# Patient Record
Sex: Male | Born: 1960 | Race: White | Hispanic: No | Smoking: Current every day smoker
Health system: Southern US, Community
[De-identification: ages and names within clinical notes are randomized; demographics above are authoritative.]

## PROBLEM LIST (undated history)

## (undated) DIAGNOSIS — K219 Gastro-esophageal reflux disease without esophagitis: Secondary | ICD-10-CM

## (undated) DIAGNOSIS — I639 Cerebral infarction, unspecified: Secondary | ICD-10-CM

## (undated) DIAGNOSIS — J449 Chronic obstructive pulmonary disease, unspecified: Secondary | ICD-10-CM

## (undated) HISTORY — PX: ORTHOPEDIC SURGERY: SHX850

## (undated) HISTORY — PX: OTHER SURGICAL HISTORY: SHX169

---

## 2006-06-16 ENCOUNTER — Emergency Department (HOSPITAL_COMMUNITY): Admission: EM | Admit: 2006-06-16 | Discharge: 2006-06-16 | Payer: Self-pay | Admitting: Emergency Medicine

## 2007-06-06 ENCOUNTER — Emergency Department (HOSPITAL_COMMUNITY): Admission: EM | Admit: 2007-06-06 | Discharge: 2007-06-06 | Payer: Self-pay | Admitting: Emergency Medicine

## 2009-05-01 ENCOUNTER — Emergency Department (HOSPITAL_COMMUNITY): Admission: EM | Admit: 2009-05-01 | Discharge: 2009-05-01 | Payer: Self-pay | Admitting: Emergency Medicine

## 2009-05-27 ENCOUNTER — Emergency Department (HOSPITAL_COMMUNITY): Admission: EM | Admit: 2009-05-27 | Discharge: 2009-05-28 | Payer: Self-pay | Admitting: Emergency Medicine

## 2009-09-14 ENCOUNTER — Emergency Department (HOSPITAL_COMMUNITY): Admission: EM | Admit: 2009-09-14 | Discharge: 2009-09-15 | Payer: Self-pay | Admitting: Emergency Medicine

## 2010-06-04 ENCOUNTER — Emergency Department (HOSPITAL_COMMUNITY): Admission: EM | Admit: 2010-06-04 | Discharge: 2010-06-05 | Payer: Self-pay | Admitting: Emergency Medicine

## 2010-07-22 ENCOUNTER — Emergency Department (HOSPITAL_COMMUNITY): Admission: EM | Admit: 2010-07-22 | Discharge: 2010-07-22 | Payer: Self-pay | Admitting: Emergency Medicine

## 2010-07-27 ENCOUNTER — Emergency Department (HOSPITAL_COMMUNITY): Admission: EM | Admit: 2010-07-27 | Discharge: 2010-07-27 | Payer: Self-pay | Admitting: Emergency Medicine
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
## 2011-03-12 LAB — CULTURE, ROUTINE-ABSCESS: Gram Stain: NONE SEEN

## 2014-02-23 ENCOUNTER — Encounter (HOSPITAL_COMMUNITY): Payer: Self-pay | Admitting: Emergency Medicine

## 2014-02-23 ENCOUNTER — Emergency Department (HOSPITAL_COMMUNITY)
Admission: EM | Admit: 2014-02-23 | Discharge: 2014-02-23 | Disposition: A | Discharge status: Home / Self Care | Payer: Self-pay | Attending: Emergency Medicine | Admitting: Emergency Medicine

## 2014-02-23 DIAGNOSIS — K029 Dental caries, unspecified: Secondary | ICD-10-CM | POA: Insufficient documentation

## 2014-02-23 DIAGNOSIS — K044 Acute apical periodontitis of pulpal origin: Secondary | ICD-10-CM | POA: Insufficient documentation

## 2014-02-23 DIAGNOSIS — K047 Periapical abscess without sinus: Secondary | ICD-10-CM

## 2014-02-23 DIAGNOSIS — F17200 Nicotine dependence, unspecified, uncomplicated: Secondary | ICD-10-CM | POA: Insufficient documentation

## 2014-02-23 MED ORDER — TRAMADOL HCL 50 MG PO TABS: 50.0000 mg | ORAL_TABLET | Freq: Four times a day (QID) | ORAL | Status: DC | PRN

## 2014-02-23 MED ORDER — AMOXICILLIN 500 MG PO CAPS: 500.0000 mg | ORAL_CAPSULE | Freq: Three times a day (TID) | ORAL | Status: DC

## 2014-02-23 MED ORDER — TRAMADOL HCL 50 MG PO TABS
50.0000 mg | ORAL_TABLET | Freq: Once | ORAL | Status: AC
  Administered 2014-02-23: 50 mg via ORAL
  Filled 2014-02-23: qty 1

## 2014-02-23 MED ORDER — AMOXICILLIN 250 MG PO CAPS
500.0000 mg | ORAL_CAPSULE | Freq: Once | ORAL | Status: AC
  Administered 2014-02-23: 500 mg via ORAL
  Filled 2014-02-23: qty 2

## 2014-02-23 NOTE — ED Provider Notes (Signed)
CSN: [id number]     Arrival date & time 02/23/14  0750 History   First MD Initiated Contact with Patient 02/23/14 [phone]     Chief Complaint  Patient presents with  . Dental Problem     (Consider location/radiation/quality/duration/timing/severity/associated sxs/prior Treatment) HPI Comments: Justin Price is a 53 y.o. Male presenting with right upper dental pain for the past 2 days.  He also had swelling around the tooth which drained a foul tasting fluid yesterday.  He has a history of chronic dental decay and is trying to get in with [facility] dental clinic to address his dental problems.  There has been no fevers, chills,  or vomiting, also no complaint of difficulty swallowing, although chewing makes pain worse. He has had some mild nausea. The patient has taken ibuprofen without relief of symptoms.         The history is provided by the patient.    History reviewed. No pertinent past medical history. History reviewed. No pertinent past surgical history. No family history on file. History  Substance Use Topics  . Smoking status: Current Every Day Smoker -- 2.00 packs/day  . Smokeless tobacco: Not on file  . Alcohol Use: No    Review of Systems  Constitutional: Negative for fever.  HENT: Positive for dental problem. Negative for facial swelling and sore throat.   Respiratory: Negative for shortness of breath.   Musculoskeletal: Negative for neck pain and neck stiffness.      Allergies  Review of patient's allergies indicates no known allergies.  Home Medications   Current Outpatient Rx  Name  Route  Sig  Dispense  Refill  . amoxicillin (AMOXIL) 500 MG capsule   Oral   Take 1 capsule (500 mg total) by mouth 3 (three) times daily.   30 capsule   0   . traMADol (ULTRAM) 50 MG tablet   Oral   Take 1 tablet (50 mg total) by mouth every 6 (six) hours as needed.   20 tablet   0    BP 116/73  Pulse 73  Temp(Src) 98.3 F (36.8 C)  Resp 18  Ht 5\' 11"   (1.803 m)  Wt 165 lb (74.844 kg)  BMI 23.02 kg/m2  SpO2 98% Physical Exam  Constitutional: He is oriented to person, place, and time. He appears well-developed and well-nourished. No distress.  HENT:  Head: Normocephalic and atraumatic.  Right Ear: Tympanic membrane and external ear normal.  Left Ear: Tympanic membrane and external ear normal.  Mouth/Throat: Oropharynx is clear and moist and mucous membranes are normal. No oral lesions. No trismus in the jaw. No dental abscesses or uvula swelling.    Eyes: Conjunctivae are normal.  Neck: Normal range of motion. Neck supple.  Cardiovascular: Normal rate and normal heart sounds.   Pulmonary/Chest: Effort normal.  Abdominal: He exhibits no distension.  Musculoskeletal: Normal range of motion.  Lymphadenopathy:       Head (right side): No submandibular, no tonsillar and no preauricular adenopathy present.       Head (left side): No submandibular, no tonsillar and no preauricular adenopathy present.    He has no cervical adenopathy.  Neurological: He is alert and oriented to person, place, and time.  Skin: Skin is warm and dry. No erythema.  Psychiatric: He has a normal mood and affect.    ED Course  Procedures (including critical care time) Labs Review Labs Reviewed - No data to display Imaging Review No results found.   EKG Interpretation  None      MDM   Final diagnoses:  Dental infection    Pt prescribed amoxil, tramadol.  Encouraged dental followup as planned.  May continue ibuprofen unless nausea persists.    Burgess AmorJulie Averie Hornbaker, PA-C 02/23/14 [phone]

## 2014-02-23 NOTE — Discharge Instructions (Signed)
°  Dental Abscess °A dental abscess is a collection of infected fluid (pus) from a bacterial infection in the inner part of the tooth (pulp). It usually occurs at the end of the tooth's root.  °CAUSES  °· Severe tooth decay. °· Trauma to the tooth that allows bacteria to enter into the pulp, such as a broken or chipped tooth. °SYMPTOMS  °· Severe pain in and around the infected tooth. °· Swelling and redness around the abscessed tooth or in the mouth or face. °· Tenderness. °· Pus drainage. °· Bad breath. °· Bitter taste in the mouth. °· Difficulty swallowing. °· Difficulty opening the mouth. °· Nausea. °· Vomiting. °· Chills. °· Swollen neck glands. °DIAGNOSIS  °· A medical and dental history will be taken. °· An examination will be performed by tapping on the abscessed tooth. °· X-rays may be taken of the tooth to identify the abscess. °TREATMENT °The goal of treatment is to eliminate the infection. You may be prescribed antibiotic medicine to stop the infection from spreading. A root canal may be performed to save the tooth. If the tooth cannot be saved, it may be pulled (extracted) and the abscess may be drained.  °HOME CARE INSTRUCTIONS °· Only take over-the-counter or prescription medicines for pain, fever, or discomfort as directed by your caregiver. °· Rinse your mouth (gargle) often with salt water (¼ tsp salt in 8 oz [250 ml] of warm water) to relieve pain or swelling. °· Do not drive after taking pain medicine (narcotics). °· Do not apply heat to the outside of your face. °· Return to your dentist for further treatment as directed. °SEEK MEDICAL CARE IF: °· Your pain is not helped by medicine. °· Your pain is getting worse instead of better. °SEEK IMMEDIATE MEDICAL CARE IF: °· You have a fever or persistent symptoms for more than 2 3 days. °· You have a fever and your symptoms suddenly get worse. °· You have chills or a very bad headache. °· You have problems breathing or swallowing. °· You have trouble  opening your mouth. °· You have swelling in the neck or around the eye. °Document Released: 11/23/2005 Document Revised: 08/17/2012 Document Reviewed: 03/03/2011 °ExitCare® Patient Information ©2014 [employer]. ° ° °

## 2014-02-23 NOTE — ED Notes (Signed)
Pt c/o right upper toothache x 2 days.

## 2014-02-23 NOTE — ED Notes (Signed)
Patient with no complaints at this time. Respirations even and unlabored. Skin warm/dry. Discharge instructions reviewed with patient at this time. Patient given opportunity to voice concerns/ask questions. Patient discharged at this time and left Emergency Department with steady gait.   

## 2014-02-28 NOTE — ED Provider Notes (Signed)
Medical screening examination/treatment/procedure(s) were performed by non-physician practitioner and as supervising physician I was immediately available for consultation/collaboration.   EKG Interpretation None        Christopher J. Pollina, MD 02/28/14 0701 

## 2014-10-13 ENCOUNTER — Encounter (HOSPITAL_COMMUNITY): Payer: Self-pay | Admitting: *Deleted

## 2014-10-13 ENCOUNTER — Emergency Department (HOSPITAL_COMMUNITY)
Admission: EM | Admit: 2014-10-13 | Discharge: 2014-10-13 | Disposition: A | Discharge status: Home / Self Care | Payer: Self-pay | Attending: Emergency Medicine | Admitting: Emergency Medicine

## 2014-10-13 DIAGNOSIS — K529 Noninfective gastroenteritis and colitis, unspecified: Secondary | ICD-10-CM

## 2014-10-13 DIAGNOSIS — Z720 Tobacco use: Secondary | ICD-10-CM | POA: Insufficient documentation

## 2014-10-13 DIAGNOSIS — K5289 Other specified noninfective gastroenteritis and colitis: Secondary | ICD-10-CM | POA: Insufficient documentation

## 2014-10-13 LAB — BASIC METABOLIC PANEL
Anion gap: 16 — ABNORMAL HIGH (ref 5–15)
BUN: 10 mg/dL (ref 6–23)
CO2: 21 mEq/L (ref 19–32)
CREATININE: 1.03 mg/dL (ref 0.50–1.35)
Calcium: 9.2 mg/dL (ref 8.4–10.5)
Chloride: 97 mEq/L (ref 96–112)
GFR, EST NON AFRICAN AMERICAN: 82 mL/min — AB (ref >90)
Glucose, Bld: 133 mg/dL — ABNORMAL HIGH (ref 70–99)
Potassium: 3.5 mEq/L — ABNORMAL LOW (ref 3.7–5.3)
SODIUM: 134 meq/L — AB (ref 137–147)

## 2014-10-13 LAB — CBC WITH DIFFERENTIAL/PLATELET
BASOS ABS: 0 10*3/uL (ref 0.0–0.1)
Basophils Relative: 0 % (ref 0–1)
Eosinophils Absolute: 0 10*3/uL (ref 0.0–0.7)
Eosinophils Relative: 0 % (ref 0–5)
HEMATOCRIT: 41.6 % (ref 39.0–52.0)
Hemoglobin: 14.9 g/dL (ref 13.0–17.0)
LYMPHS ABS: 0.9 10*3/uL (ref 0.7–4.0)
LYMPHS PCT: 5 % — AB (ref 12–46)
MCH: 32.4 pg (ref 26.0–34.0)
MCHC: 35.8 g/dL (ref 30.0–36.0)
MCV: 90.4 fL (ref 78.0–100.0)
MONOS PCT: 6 % (ref 3–12)
Monocytes Absolute: 1.2 10*3/uL — ABNORMAL HIGH (ref 0.1–1.0)
Neutro Abs: 17 10*3/uL — ABNORMAL HIGH (ref 1.7–7.7)
Neutrophils Relative %: 89 % — ABNORMAL HIGH (ref 43–77)
PLATELETS: 248 10*3/uL (ref 150–400)
RBC: 4.6 MIL/uL (ref 4.22–5.81)
RDW: 13.3 % (ref 11.5–15.5)
WBC: 19.1 10*3/uL — AB (ref 4.0–10.5)

## 2014-10-13 MED ORDER — ONDANSETRON HCL 4 MG/2ML IJ SOLN
4.0000 mg | Freq: Once | INTRAMUSCULAR | Status: AC
  Administered 2014-10-13: 4 mg via INTRAVENOUS
  Filled 2014-10-13: qty 2

## 2014-10-13 MED ORDER — ONDANSETRON 4 MG PO TBDP: 4.0000 mg | ORAL_TABLET | Freq: Three times a day (TID) | ORAL | Status: DC | PRN

## 2014-10-13 MED ORDER — METRONIDAZOLE 500 MG PO TABS
500.0000 mg | ORAL_TABLET | Freq: Once | ORAL | Status: AC
  Administered 2014-10-13: 500 mg via ORAL
  Filled 2014-10-13: qty 1

## 2014-10-13 MED ORDER — CIPROFLOXACIN HCL 250 MG PO TABS
500.0000 mg | ORAL_TABLET | Freq: Once | ORAL | Status: AC
  Administered 2014-10-13: 500 mg via ORAL
  Filled 2014-10-13: qty 2

## 2014-10-13 MED ORDER — METRONIDAZOLE 500 MG PO TABS: 500.0000 mg | ORAL_TABLET | Freq: Two times a day (BID) | ORAL | Status: DC

## 2014-10-13 MED ORDER — DICYCLOMINE HCL 20 MG PO TABS: 20.0000 mg | ORAL_TABLET | Freq: Two times a day (BID) | ORAL | Status: DC

## 2014-10-13 MED ORDER — GLYCOPYRROLATE 0.2 MG/ML IJ SOLN
0.3000 mg | Freq: Once | INTRAMUSCULAR | Status: AC
  Administered 2014-10-13: 0.3 mg via INTRAVENOUS
  Filled 2014-10-13: qty 2

## 2014-10-13 MED ORDER — CIPROFLOXACIN HCL 500 MG PO TABS: 500.0000 mg | ORAL_TABLET | Freq: Two times a day (BID) | ORAL | Status: DC

## 2014-10-13 MED ORDER — ACETAMINOPHEN 325 MG PO TABS
650.0000 mg | ORAL_TABLET | Freq: Once | ORAL | Status: AC
  Administered 2014-10-13: 650 mg via ORAL
  Filled 2014-10-13: qty 2

## 2014-10-13 MED ORDER — DEXTROSE 5 % IV SOLN
1000.0000 mg | Freq: Once | INTRAVENOUS | Status: DC
  Filled 2014-10-13: qty 10

## 2014-10-13 MED ORDER — KETOROLAC TROMETHAMINE 30 MG/ML IJ SOLN
30.0000 mg | Freq: Once | INTRAMUSCULAR | Status: AC
  Administered 2014-10-13: 30 mg via INTRAVENOUS
  Filled 2014-10-13: qty 1

## 2014-10-13 MED ORDER — SODIUM CHLORIDE 0.9 % IV BOLUS (SEPSIS)
1000.0000 mL | Freq: Once | INTRAVENOUS | Status: AC
  Administered 2014-10-13: 1000 mL via INTRAVENOUS

## 2014-10-13 NOTE — ED Provider Notes (Signed)
CSN: [id number]     Arrival date & time 10/13/14  [id number] History  This chart was scribe for Rolland PorterMark Sennie Borden, MD by Angelene GiovanniEmmanuella Mensah, ED Scribe. The patient was seen in room APA06/APA06 and the patient's care was started at 7:34 AM.    Chief Complaint  Patient presents with  . Fever    The history is provided by the patient. No language interpreter was used.   HPI Comments: Justin Price is a 53 y.o. male who presents to the Emergency Department complaining of intermittent severe stomach cramps. He reports associated fever, vomiting, diarrhea, acid reflex,CP, and slight dizziness. He denies blood in his diarrhea. He states that the cramp is worse after vomiting and after the diarrheal episodes. He reports that his wife was here a few weeks ago for 3 days with similar symptoms. She states that her sickness was attributed to a virus. He has NKDA.   History reviewed. No pertinent past medical history. History reviewed. No pertinent past surgical history. No family history on file. History  Substance Use Topics  . Smoking status: Current Every Day Smoker -- 2.00 packs/day  . Smokeless tobacco: Not on file  . Alcohol Use: No    Review of Systems  Constitutional: Negative for diaphoresis, appetite change and fatigue.  HENT: Negative for mouth sores and trouble swallowing.   Eyes: Negative for visual disturbance.  Respiratory: Negative for chest tightness, shortness of breath and wheezing.   Gastrointestinal: Negative for abdominal pain and abdominal distention.  Endocrine: Negative for polydipsia, polyphagia and polyuria.  Genitourinary: Negative for frequency and hematuria.  Musculoskeletal: Negative for gait problem.  Skin: Negative for color change and pallor.  Neurological: Negative for dizziness, syncope and light-headedness.  Hematological: Does not bruise/bleed easily.  Psychiatric/Behavioral: Negative for behavioral problems.      Allergies  Review of patient's allergies  indicates no known allergies.  Home Medications   Prior to Admission medications   Medication Sig Start Date End Date Taking? Authorizing Provider  amoxicillin (AMOXIL) 500 MG capsule Take 1 capsule (500 mg total) by mouth 3 (three) times daily. Patient not taking: Reported on 10/13/2014 02/23/14   Burgess AmorJulie Idol, PA-C  ciprofloxacin (CIPRO) 500 MG tablet Take 1 tablet (500 mg total) by mouth every 12 (twelve) hours. 10/13/14   Rolland PorterMark Dewarren Ledbetter, MD  dicyclomine (BENTYL) 20 MG tablet Take 1 tablet (20 mg total) by mouth 2 (two) times daily. 10/13/14   Rolland PorterMark Onnika Siebel, MD  metroNIDAZOLE (FLAGYL) 500 MG tablet Take 1 tablet (500 mg total) by mouth 2 (two) times daily. 10/13/14   Rolland PorterMark Nieko Clarin, MD  ondansetron (ZOFRAN ODT) 4 MG disintegrating tablet Take 1 tablet (4 mg total) by mouth every 8 (eight) hours as needed for nausea. 10/13/14   Rolland PorterMark Raye Wiens, MD  traMADol (ULTRAM) 50 MG tablet Take 1 tablet (50 mg total) by mouth every 6 (six) hours as needed. Patient not taking: Reported on 10/13/2014 02/23/14   Burgess AmorJulie Idol, PA-C   BP 109/80 mmHg  Temp(Src) 100.3 F (37.9 C) (Oral)  Resp 22  Ht 5\' 10"  (1.778 m)  Wt 160 lb (72.576 kg)  BMI 22.96 kg/m2  SpO2 97% Physical Exam  Constitutional: He is oriented to person, place, and time. He appears well-developed and well-nourished. No distress.  HENT:  Head: Normocephalic.  Eyes: Conjunctivae are normal. Pupils are equal, round, and reactive to light. No scleral icterus.  Neck: Normal range of motion. Neck supple. No thyromegaly present.  Cardiovascular: Normal rate and regular rhythm.  Exam  reveals no gallop and no friction rub.   No murmur heard. Pulmonary/Chest: Effort normal and breath sounds normal. No respiratory distress. He has no wheezes. He has no rales.  Abdominal: Soft. Bowel sounds are normal. He exhibits no distension. There is no tenderness. There is no rebound.  Complains of diffuse pain but non tender on exam.  Musculoskeletal: Normal range of motion.   Neurological: He is alert and oriented to person, place, and time.  Skin: Skin is warm and dry. No rash noted.  Psychiatric: He has a normal mood and affect. His behavior is normal.    ED Course  Procedures (including critical care time) DIAGNOSTIC STUDIES: Oxygen Saturation is 97% on RA, adequate by my interpretation.    COORDINATION OF CARE: 7:38 AM- Pt advised of plan for treatment and pt agrees.  Labs Review Labs Reviewed  CBC WITH DIFFERENTIAL - Abnormal; Notable for the following:    WBC 19.1 (*)    Neutrophils Relative % 89 (*)    Neutro Abs 17.0 (*)    Lymphocytes Relative 5 (*)    Monocytes Absolute 1.2 (*)    All other components within normal limits  BASIC METABOLIC PANEL - Abnormal; Notable for the following:    Sodium 134 (*)    Potassium 3.5 (*)    Glucose, Bld 133 (*)    GFR calc non Af Amer 82 (*)    Anion gap 16 (*)    All other components within normal limits    Imaging Review No results found.   EKG Interpretation None      MDM   Final diagnoses:  Colitis      I personally performed the services described in this documentation, which was scribed in my presence. The recorded information has been reviewed and is accurate.    Rolland PorterMark Oluwaseun Bruyere, MD 10/13/14 [phone]

## 2014-10-13 NOTE — ED Notes (Signed)
Pt states he started feeling like he was getting sick a few days ago. Starting yesterday he began having nausea, vomiting, and diarrhea. A fever accompanies these symptoms. Pt has taken tylenol with little relief in fever.

## 2014-10-13 NOTE — ED Notes (Signed)
Patient reports feeling sick for the past three weeks. Pt states his symptoms got worse last night. Pt states he had a temp 104 last night. Last night. Pt reports taking 50 mg ibuprofen x 2 pills last night @ 1030 p.m. And 800 mg ibuprofen @ 12.00 p.m. Pt had two episodes of emesis and 10 episodes of diarrhea. Pt reports "greenish black" liquid stool. Pt states his stool is "smelly."

## 2014-10-13 NOTE — Discharge Instructions (Signed)
Return to the emergency room with worsening pain, or any pain that localizes to one spot in your abdomen. Good handwashing, use hand sanitizers.  Colitis Colitis is inflammation of the colon. Colitis can be a short-term or long-standing (chronic) illness. Crohn's disease and ulcerative colitis are 2 types of colitis which are chronic. They usually require lifelong treatment. CAUSES  There are many different causes of colitis, including:  Viruses.  Germs (bacteria).  Medicine reactions. SYMPTOMS   Diarrhea.  Intestinal bleeding.  Pain.  Fever.  Throwing up (vomiting).  Tiredness (fatigue).  Weight loss.  Bowel blockage. DIAGNOSIS  The diagnosis of colitis is based on examination and stool or blood tests. X-rays, CT scan, and colonoscopy may also be needed. TREATMENT  Treatment may include:  Fluids given through the vein (intravenously).  Bowel rest (nothing to eat or drink for a period of time).  Medicine for pain and diarrhea.  Medicines (antibiotics) that kill germs.  Cortisone medicines.  Surgery. HOME CARE INSTRUCTIONS   Get plenty of rest.  Drink enough water and fluids to keep your urine clear or pale yellow.  Eat a well-balanced diet.  Call your caregiver for follow-up as recommended. SEEK IMMEDIATE MEDICAL CARE IF:   You develop chills.  You have an oral temperature above 102 F (38.9 C), not controlled by medicine.  You have extreme weakness, fainting, or dehydration.  You have repeated vomiting.  You develop severe belly (abdominal) pain or are passing bloody or tarry stools. MAKE SURE YOU:   Understand these instructions.  Will watch your condition.  Will get help right away if you are not doing well or get worse. Document Released: 12/31/2004 Document Revised: 02/15/2012 Document Reviewed: 03/28/2010 [facility] Patient Information 2015 [place], [place]. This information is not intended to replace advice given to you by your health  care provider. Make sure you discuss any questions you have with your health care provider.

## 2017-10-09 ENCOUNTER — Emergency Department (HOSPITAL_COMMUNITY): Payer: Self-pay

## 2017-10-09 ENCOUNTER — Encounter (HOSPITAL_COMMUNITY): Payer: Self-pay | Admitting: *Deleted

## 2017-10-09 ENCOUNTER — Emergency Department (HOSPITAL_COMMUNITY)
Admission: EM | Admit: 2017-10-09 | Discharge: 2017-10-09 | Disposition: A | Discharge status: Home / Self Care | Payer: Self-pay | Attending: Emergency Medicine | Admitting: Emergency Medicine

## 2017-10-09 DIAGNOSIS — J441 Chronic obstructive pulmonary disease with (acute) exacerbation: Secondary | ICD-10-CM | POA: Insufficient documentation

## 2017-10-09 DIAGNOSIS — F17210 Nicotine dependence, cigarettes, uncomplicated: Secondary | ICD-10-CM | POA: Insufficient documentation

## 2017-10-09 LAB — COMPREHENSIVE METABOLIC PANEL
ALBUMIN: 3.7 g/dL (ref 3.5–5.0)
ALK PHOS: 85 U/L (ref 38–126)
ALT: 20 U/L (ref 17–63)
ANION GAP: 7 (ref 5–15)
AST: 19 U/L (ref 15–41)
BUN: 8 mg/dL (ref 6–20)
CALCIUM: 9.3 mg/dL (ref 8.9–10.3)
CO2: 28 mmol/L (ref 22–32)
Chloride: 105 mmol/L (ref 101–111)
Creatinine, Ser: 0.89 mg/dL (ref 0.61–1.24)
GFR calc Af Amer: >60 mL/min (ref >60)
GFR calc non Af Amer: >60 mL/min (ref >60)
GLUCOSE: 91 mg/dL (ref 65–99)
Potassium: 4.1 mmol/L (ref 3.5–5.1)
SODIUM: 140 mmol/L (ref 135–145)
Total Bilirubin: 0.4 mg/dL (ref 0.3–1.2)
Total Protein: 6.7 g/dL (ref 6.5–8.1)

## 2017-10-09 LAB — CBC WITH DIFFERENTIAL/PLATELET
BASOS ABS: 0 10*3/uL (ref 0.0–0.1)
BASOS PCT: 0 %
EOS ABS: 0.4 10*3/uL (ref 0.0–0.7)
Eosinophils Relative: 5 %
HCT: 41.2 % (ref 39.0–52.0)
HEMOGLOBIN: 14 g/dL (ref 13.0–17.0)
Lymphocytes Relative: 30 %
Lymphs Abs: 2.7 10*3/uL (ref 0.7–4.0)
MCH: 31.8 pg (ref 26.0–34.0)
MCHC: 34 g/dL (ref 30.0–36.0)
MCV: 93.6 fL (ref 78.0–100.0)
Monocytes Absolute: 0.6 10*3/uL (ref 0.1–1.0)
Monocytes Relative: 7 %
NEUTROS ABS: 5.4 10*3/uL (ref 1.7–7.7)
NEUTROS PCT: 58 %
Platelets: 304 10*3/uL (ref 150–400)
RBC: 4.4 MIL/uL (ref 4.22–5.81)
RDW: 13.6 % (ref 11.5–15.5)
WBC: 9.1 10*3/uL (ref 4.0–10.5)

## 2017-10-09 LAB — TROPONIN I: Troponin I: <0.03 ng/mL (ref <0.03)

## 2017-10-09 IMAGING — CT CT ANGIO CHEST-ABD-PELV FOR DISSECTION W/ AND WO/W CM
2 of 7 series · 13 of 46 positions shown, 15 images · IV contrast (Isovue)
Comparison: Chest radiograph dated [DATE].

CLINICAL DATA: 56-year-old male with shortness of breath and chest
pressure.

EXAM:
CT ANGIOGRAPHY CHEST, ABDOMEN AND PELVIS
TECHNIQUE: Multidetector CT imaging through the chest, abdomen and pelvis was
performed using the standard protocol during bolus administration of
intravenous contrast. Multiplanar reconstructed images and MIPs were
obtained and reviewed to evaluate the vascular anatomy.
CONTRAST:  100 cc Isovue 370

[Series 7: dissection axial arterial · axial · arterial · 0.72mm/px · z∈[+817,+1387]mm · 10 of 220 slices shown, 12 images]
[im 15/220  soft-tissue]
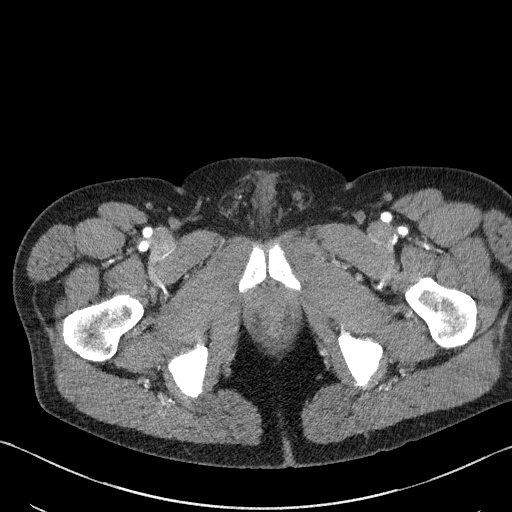
[im 15/220  bone]
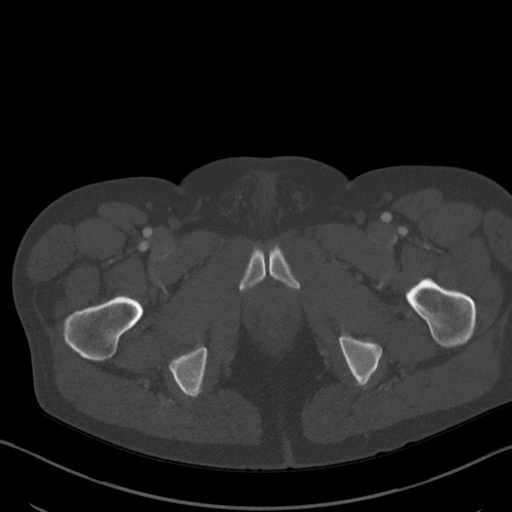
[im 44/220  soft-tissue]
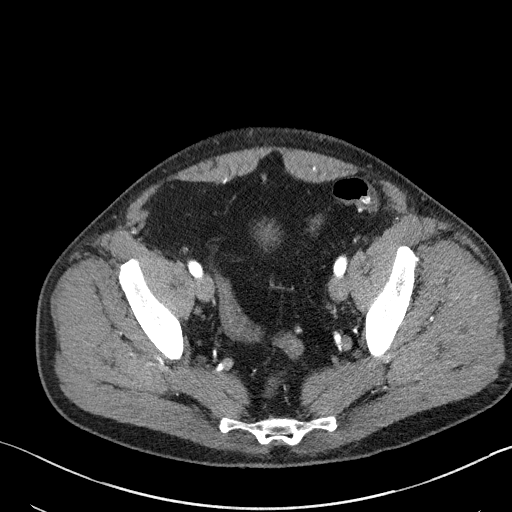
[im 59/220  soft-tissue]
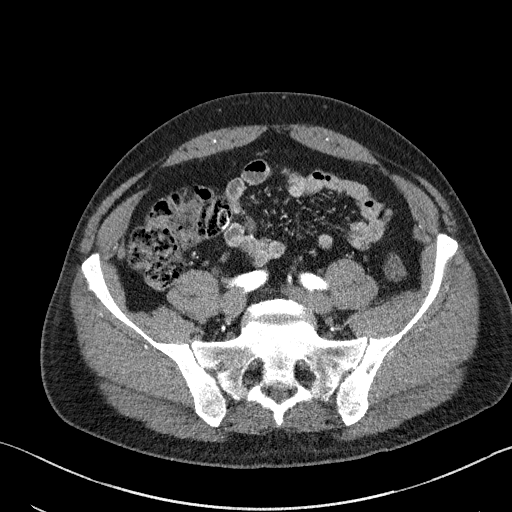
[im 74/220  soft-tissue]
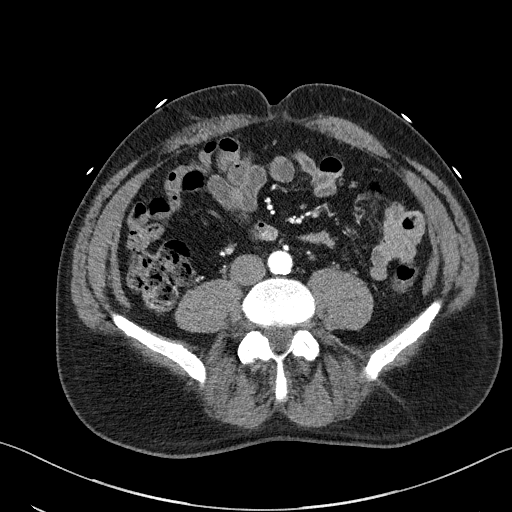
[im 103/220  soft-tissue]
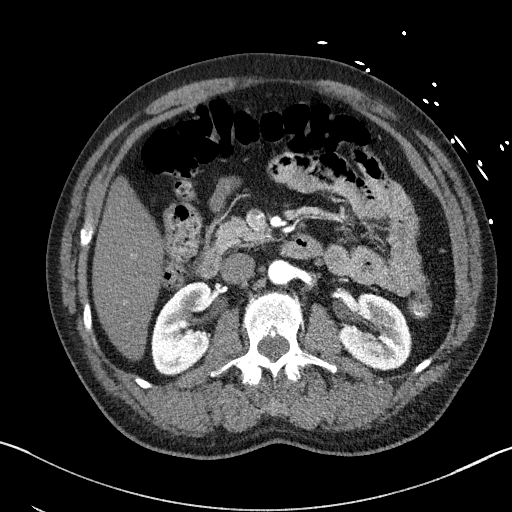
[im 117/220  soft-tissue]
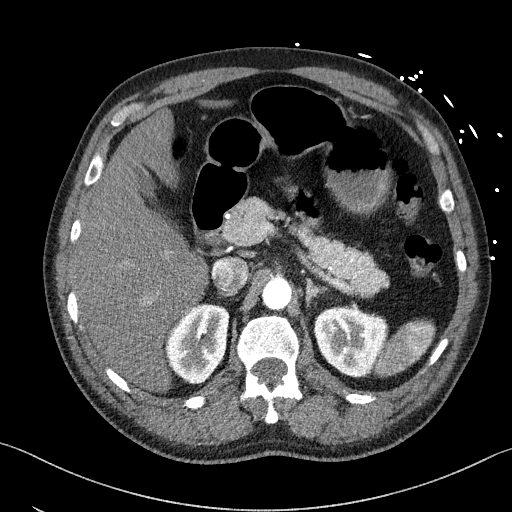
[im 147/220  soft-tissue]
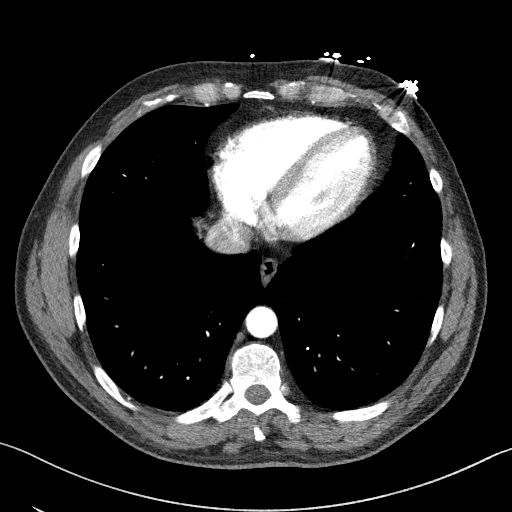
[im 161/220  soft-tissue]
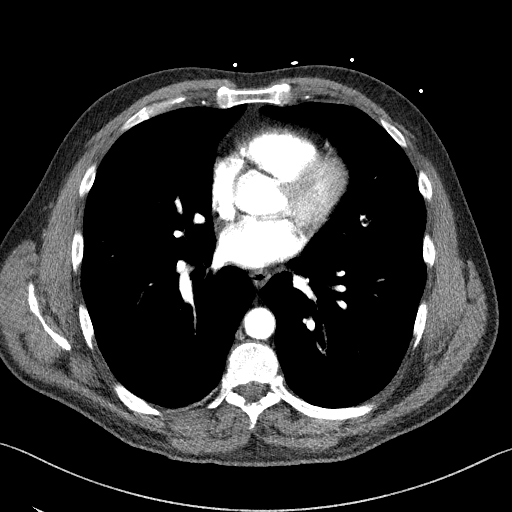
[im 176/220  soft-tissue]
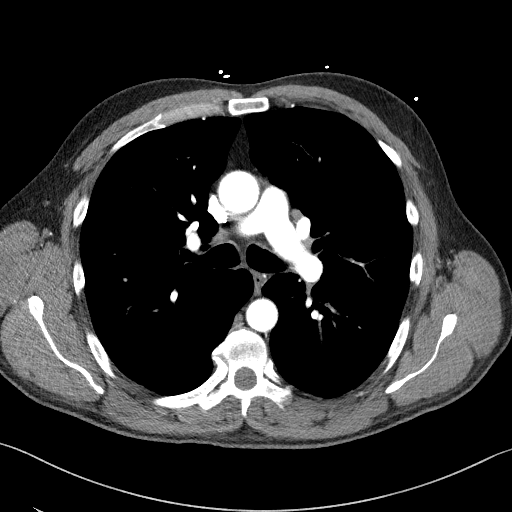
[im 176/220  bone]
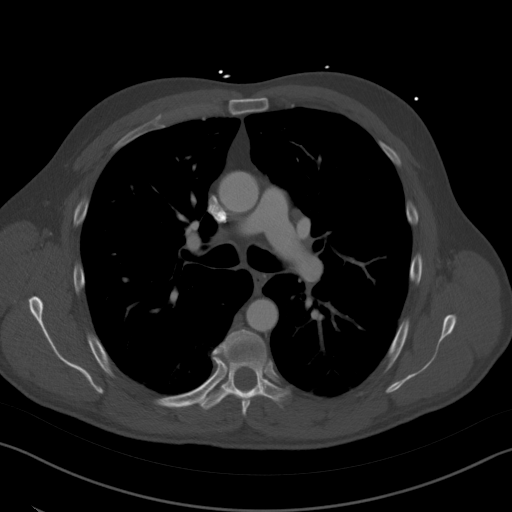
[im 205/220  soft-tissue]
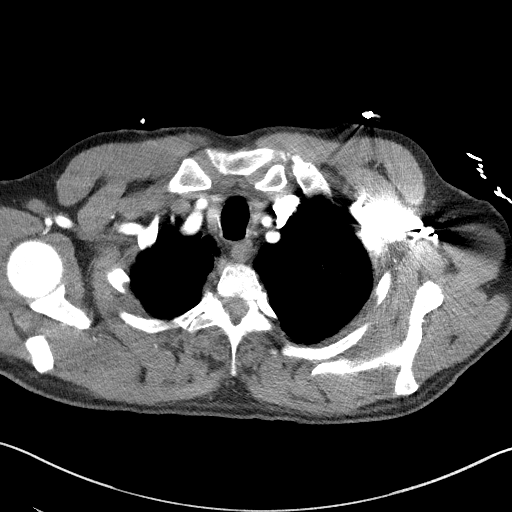

[Series 11: coronals · coronal · 0.68mm/px · 3 of 147 slices shown]
[im 37/147  soft-tissue]
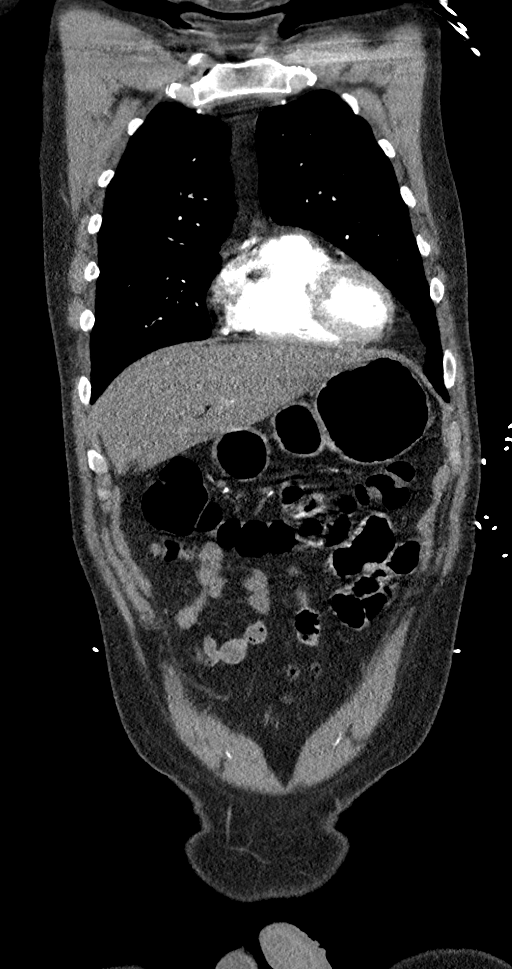
[im 74/147  soft-tissue]
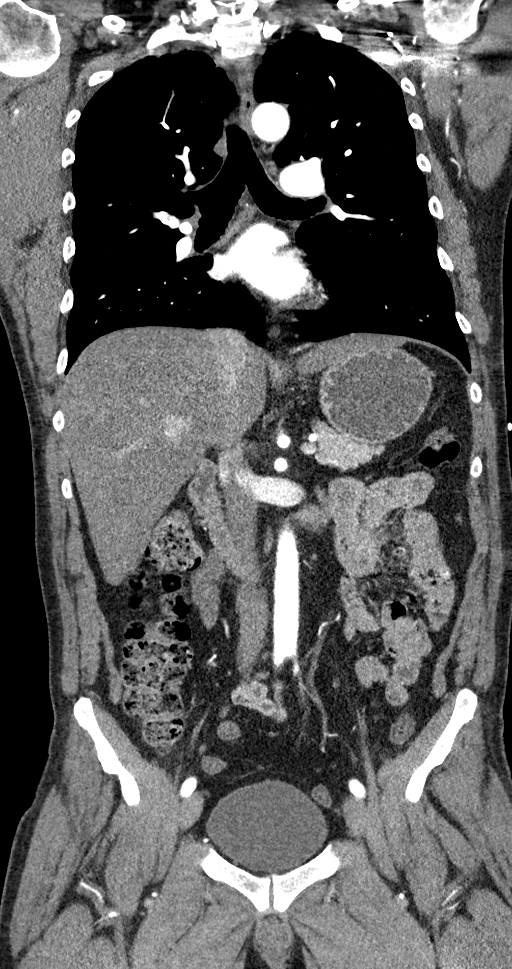
[im 110/147  soft-tissue]
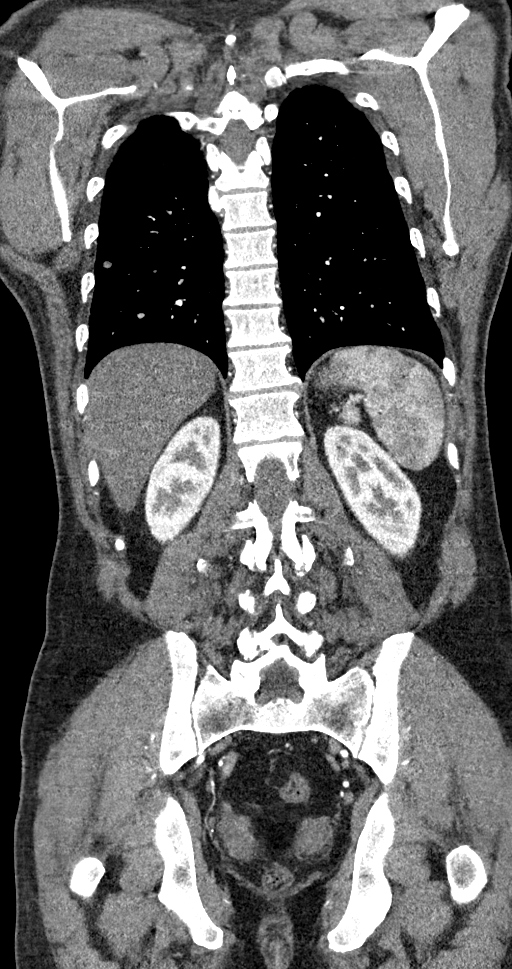

[13 of 46 positions shown; findings below may reference images not displayed]

FINDINGS: CTA CHEST FINDINGS

Cardiovascular: There is no cardiomegaly or pericardial effusion.
The thoracic aorta is unremarkable. There is no aneurysmal
dilatation or evidence of dissection. The origins of the great
vessels of the aortic arch appear patent. There is no CT evidence of
pulmonary embolism.

Mediastinum/Nodes: There is no hilar or mediastinal adenopathy. The
esophagus and the thyroid gland are grossly unremarkable.

Lungs/Pleura: There is moderate to severe centrilobular emphysema.
There is a 4 mm nodule in the left upper lobe (series 9, image 42).
A 7 mm nodule in the right lower lobe (series 9, image 91) as well
as smaller right lower lobe nodules noted. There is no focal
consolidation, pleural effusion, or pneumothorax. The central
airways are patent.

Musculoskeletal:  No acute osseous pathology.

Review of the MIP images confirms the above findings.

CTA ABDOMEN AND PELVIS FINDINGS

VASCULAR

Aorta: Mild atherosclerotic disease. No aneurysmal dilatation or
evidence of dissection. No periaortic inflammatory changes.

Celiac: Patent without evidence of aneurysm, dissection, vasculitis
or significant stenosis.

SMA: Patent without evidence of aneurysm, dissection, vasculitis or
significant stenosis.

Renals: Both renal arteries are patent without evidence of aneurysm,
dissection, vasculitis, fibromuscular dysplasia or significant
stenosis.

IMA: Patent without evidence of aneurysm, dissection, vasculitis or
significant stenosis.

Inflow: Patent without evidence of aneurysm, dissection, vasculitis
or significant stenosis.

Veins: No obvious venous abnormality within the limitations of this
arterial phase study.

Review of the MIP images confirms the above findings.

NON-VASCULAR

Hepatobiliary: There is mild diffuse fatty infiltration of the
liver. No intrahepatic biliary ductal dilatation. The gallbladder is
predominantly contracted but appears unremarkable.

Pancreas: Unremarkable. No pancreatic ductal dilatation or
surrounding inflammatory changes.

Spleen: Normal in size without focal abnormality.

Adrenals/Urinary Tract: Adrenal glands are unremarkable. Kidneys are
normal, without renal calculi, focal lesion, or hydronephrosis.
Bladder is unremarkable.

Stomach/Bowel: Small scattered sigmoid diverticula. No bowel
obstruction or active inflammation. Normal appendix.

Lymphatic: No adenopathy.

Reproductive: The prostate and seminal vesicles are grossly
unremarkable.

Other: No abdominal wall hernia or abnormality. No abdominopelvic
ascites.

Musculoskeletal: Mild degenerative changes of the spine. No acute
osseous pathology.

Review of the MIP images confirms the above findings.
IMPRESSION: 1. No acute intrathoracic, abdominal, or pelvic pathology. No aortic
dissection or aneurysm. No CT evidence of pulmonary embolism.
2. Emphysema.  No pneumothorax.
3. Scattered bilateral pulmonary nodules measure up to 7 mm in the
right lower lobe. Non-contrast chest CT at 3-6 months is
recommended. If the nodules are stable at time of repeat CT, then
future CT at 18-24 months (from today's scan) is considered optional
for low-risk patients, but is recommended for high-risk patients.
This recommendation follows the consensus statement: Guidelines for
Management of Incidental Pulmonary Nodules Detected on CT Images:

## 2017-10-09 IMAGING — DX DG CHEST 2V
2 series · 2 of 2 positions shown · non-contrast
Comparison: None.

CLINICAL DATA: Shortness of breath for a week.

EXAM:
CHEST  2 VIEW

[chest pa]
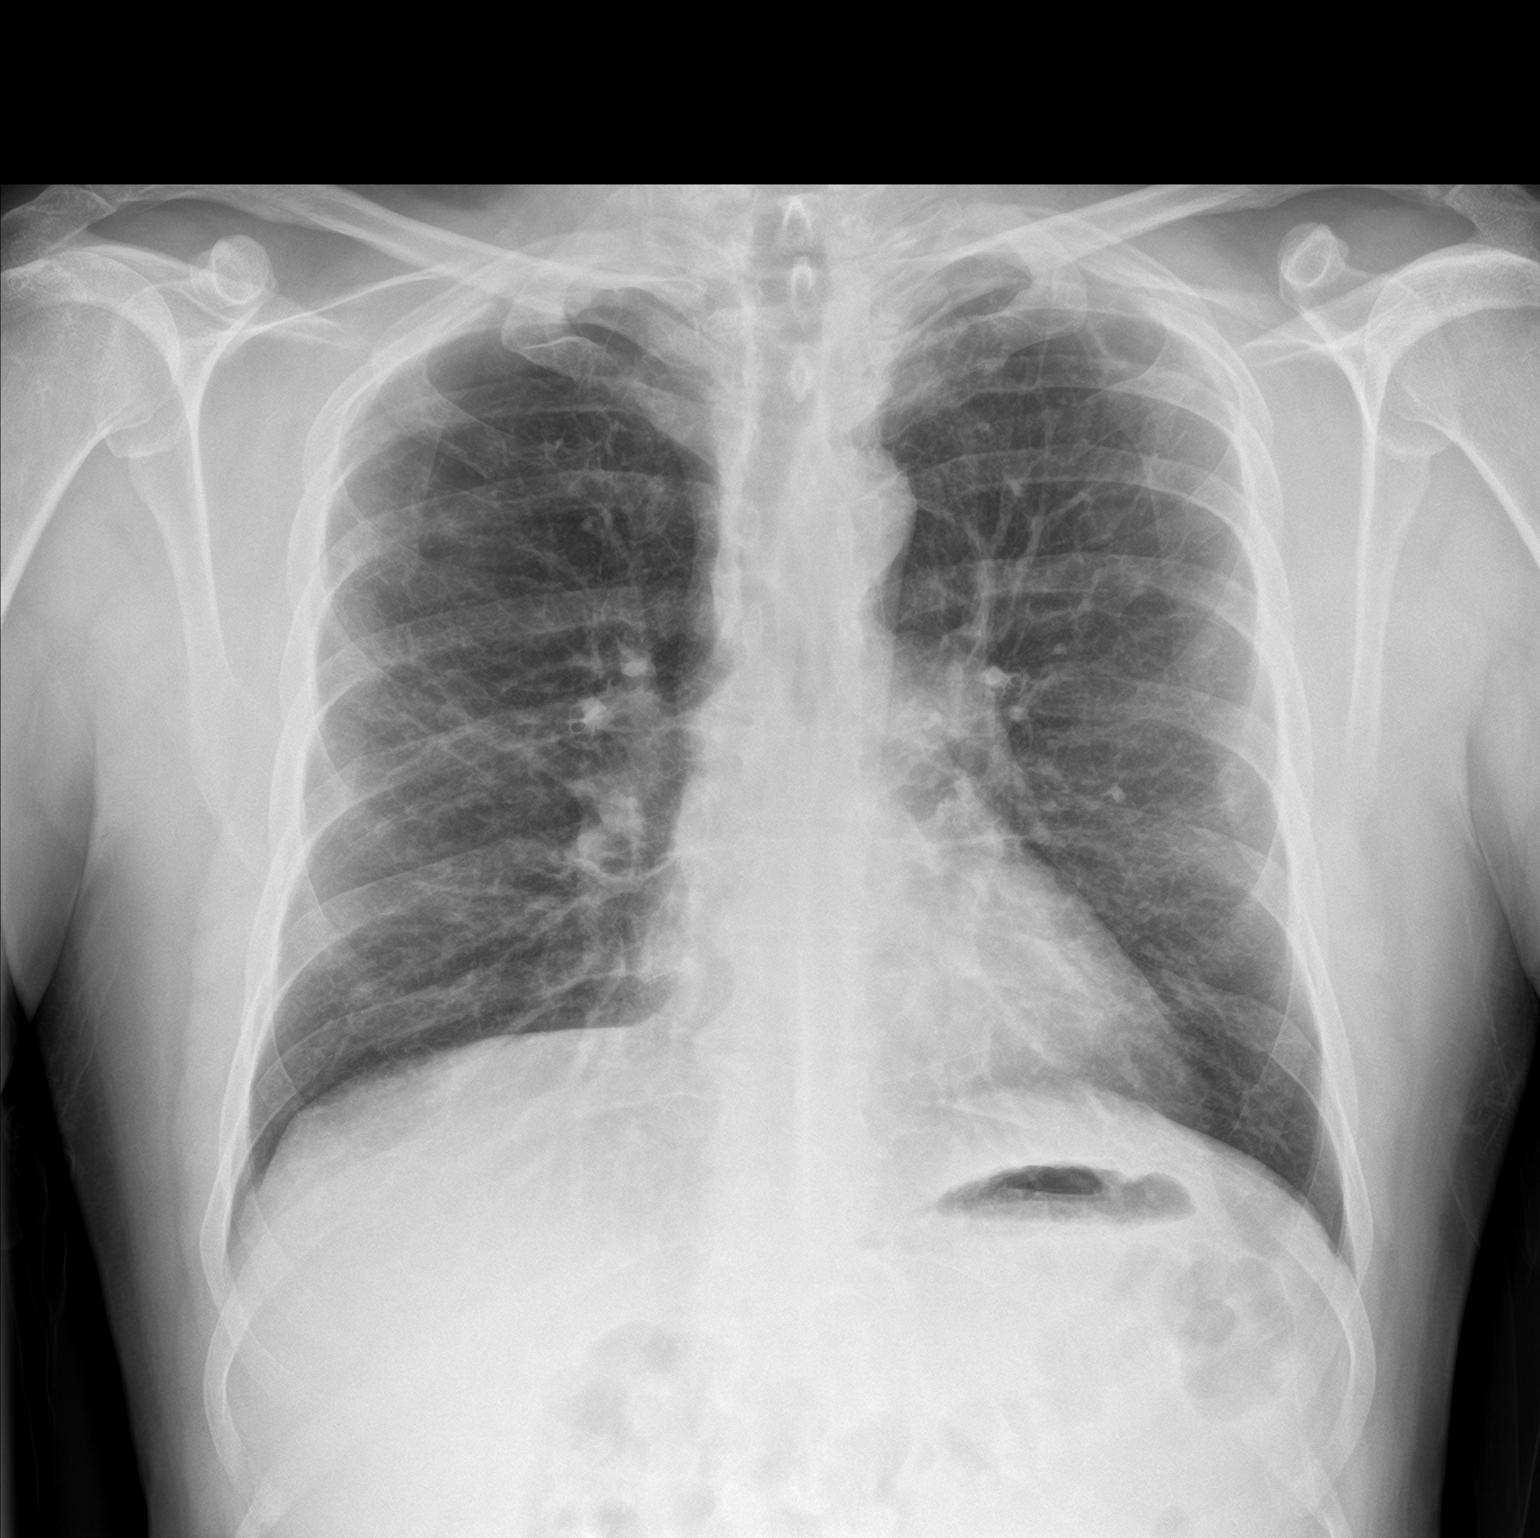

[chest lat]
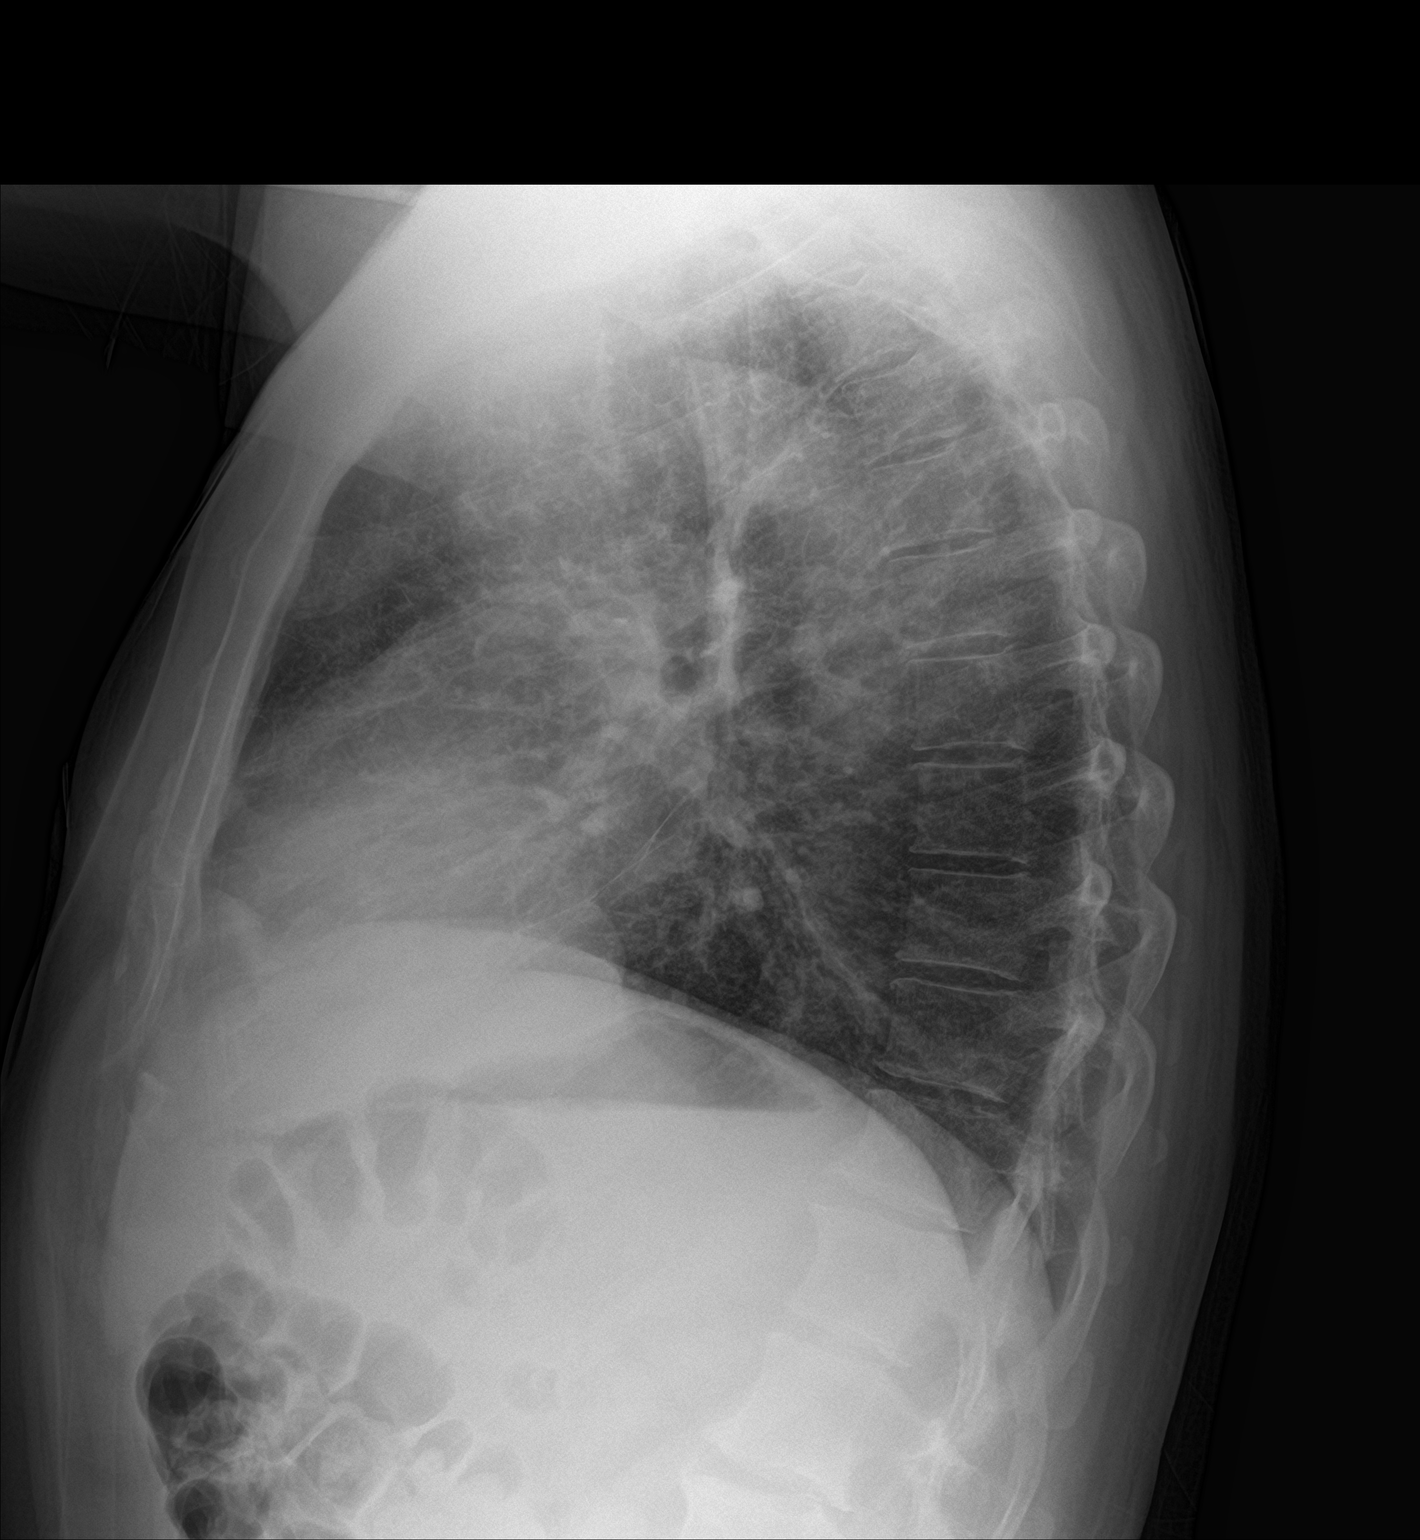

[2 of 2 positions shown; findings below may reference images not displayed]

FINDINGS: The heart, hila, and mediastinum are normal. Coarsened lung markings
with no focal nodule, mass, or focal infiltrate. No other acute
abnormalities.
IMPRESSION: Coarsened lung markings may be chronic.  No acute abnormality noted.

## 2017-10-09 MED ORDER — ALBUTEROL SULFATE HFA 108 (90 BASE) MCG/ACT IN AERS
2.0000 | INHALATION_SPRAY | RESPIRATORY_TRACT | Status: DC | PRN
  Administered 2017-10-09: 2 via RESPIRATORY_TRACT
  Filled 2017-10-09: qty 6.7

## 2017-10-09 MED ORDER — PREDNISONE 20 MG PO TABS: ORAL_TABLET | ORAL | 0 refills | Status: DC

## 2017-10-09 MED ORDER — IOPAMIDOL (ISOVUE-370) INJECTION 76%
100.0000 mL | Freq: Once | INTRAVENOUS | Status: AC | PRN
  Administered 2017-10-09: 100 mL via INTRAVENOUS

## 2017-10-09 MED ORDER — IPRATROPIUM-ALBUTEROL 0.5-2.5 (3) MG/3ML IN SOLN
3.0000 mL | Freq: Once | RESPIRATORY_TRACT | Status: AC
  Administered 2017-10-09: 3 mL via RESPIRATORY_TRACT
  Filled 2017-10-09: qty 3

## 2017-10-09 MED ORDER — ALBUTEROL SULFATE (2.5 MG/3ML) 0.083% IN NEBU
2.5000 mg | INHALATION_SOLUTION | Freq: Once | RESPIRATORY_TRACT | Status: AC
  Administered 2017-10-09: 2.5 mg via RESPIRATORY_TRACT
  Filled 2017-10-09: qty 3

## 2017-10-09 MED ORDER — PREDNISONE 10 MG PO TABS
60.0000 mg | ORAL_TABLET | Freq: Once | ORAL | Status: AC
  Administered 2017-10-09: 60 mg via ORAL
  Filled 2017-10-09: qty 1

## 2017-10-09 NOTE — ED Triage Notes (Signed)
Pt with sob for a week, c/o soreness throughout chest.

## 2017-10-09 NOTE — Discharge Instructions (Signed)
Follow-up with family doctor in the next couple weeks for recheck.  He will need to get a another CT scan of your chest and 3-6 months to make sure those small lesions have not changed

## 2017-10-09 NOTE — ED Notes (Signed)
Respiratory contacted for breathing treatment

## 2017-10-10 NOTE — ED Provider Notes (Signed)
[facility] EMERGENCY DEPARTMENT Provider Note   CSN: [id number] Arrival date & time: 10/09/17  1823     History   Chief Complaint Chief Complaint  Patient presents with  . Shortness of Breath    HPI Justin Price is a 56 y.o. male.  Patient complains of shortness of breath and wheezing and cough   The history is provided by the patient.  Shortness of Breath  This is a recurrent problem. The problem occurs intermittently.The current episode started more than 2 days ago. The problem has not changed since onset.Associated symptoms include wheezing. Pertinent negatives include no fever, no headaches, no cough, no chest pain, no abdominal pain and no rash.    History reviewed. No pertinent past medical history.  There are no active problems to display for this patient.   History reviewed. No pertinent surgical history.     Home Medications    Prior to Admission medications   Medication Sig Start Date End Date Taking? Authorizing Provider  ibuprofen (ADVIL,MOTRIN) 200 MG tablet Take 400 mg by mouth every 6 (six) hours as needed for moderate pain.   Yes [provider]  predniSONE (DELTASONE) 20 MG tablet 2 tabs po daily x 3 days 10/09/17   Bethann Berkshire, MD    Family History History reviewed. No pertinent family history.  Social History Social History   Tobacco Use  . Smoking status: Current Every Day Smoker    Packs/day: 2.00  . Smokeless tobacco: Never Used  Substance Use Topics  . Alcohol use: No  . Drug use: No     Allergies   Patient has no known allergies.   Review of Systems Review of Systems  Constitutional: Negative for appetite change, fatigue and fever.  HENT: Negative for congestion, ear discharge and sinus pressure.   Eyes: Negative for discharge.  Respiratory: Positive for shortness of breath and wheezing. Negative for cough.   Cardiovascular: Negative for chest pain.  Gastrointestinal: Negative for abdominal pain and  diarrhea.  Genitourinary: Negative for frequency and hematuria.  Musculoskeletal: Negative for back pain.  Skin: Negative for rash.  Neurological: Negative for seizures and headaches.  Psychiatric/Behavioral: Negative for hallucinations.     Physical Exam Updated Vital Signs BP (!) 144/84 (BP Location: Right Arm)   Pulse 79   Temp 98.1 F (36.7 C) (Oral)   Resp 18   Ht 5\' 9"  (1.753 m)   Wt 83.9 kg (185 lb)   SpO2 98%   BMI 27.32 kg/m   Physical Exam  Constitutional: He is oriented to person, place, and time. He appears well-developed.  HENT:  Head: Normocephalic.  Eyes: Conjunctivae and EOM are normal. No scleral icterus.  Neck: Neck supple. No thyromegaly present.  Cardiovascular: Normal rate and regular rhythm. Exam reveals no gallop and no friction rub.  No murmur heard. Pulmonary/Chest: No stridor. He has wheezes. He has no rales. He exhibits no tenderness.  Abdominal: He exhibits no distension. There is no tenderness. There is no rebound.  Musculoskeletal: Normal range of motion. He exhibits no edema.  Lymphadenopathy:    He has no cervical adenopathy.  Neurological: He is oriented to person, place, and time. He exhibits normal muscle tone. Coordination normal.  Skin: No rash noted. No erythema.  Psychiatric: He has a normal mood and affect. His behavior is normal.     ED Treatments / Results  Labs (all labs ordered are listed, but only abnormal results are displayed) Labs Reviewed  TROPONIN I  CBC  WITH DIFFERENTIAL/PLATELET  COMPREHENSIVE METABOLIC PANEL    EKG  EKG Interpretation  Date/Time:  Saturday October 09 2017 19:13:57 EDT Ventricular Rate:  73 PR Interval:    QRS Duration: 78 QT Interval:  365 QTC Calculation: 403 R Axis:   74 Text Interpretation:  Sinus rhythm Confirmed by Bethann Berkshire [id number] on 10/09/2017 9:47:58 PM       Radiology Dg Chest 2 View  Result Date: 10/09/2017 CLINICAL DATA:  Shortness of breath for a week. EXAM:  CHEST  2 VIEW COMPARISON:  None. FINDINGS: The heart, hila, and mediastinum are normal. Coarsened lung markings with no focal nodule, mass, or focal infiltrate. No other acute abnormalities. IMPRESSION: Coarsened lung markings may be chronic.  No acute abnormality noted. Electronically Signed   By: Gerome Sam III M.D   On: 10/09/2017 19:15   Ct Angio Chest/abd/pel For Dissection W And/or Wo Contrast  Result Date: 10/09/2017 CLINICAL DATA:  56 year old male with shortness of breath and chest pressure. EXAM: CT ANGIOGRAPHY CHEST, ABDOMEN AND PELVIS TECHNIQUE: Multidetector CT imaging through the chest, abdomen and pelvis was performed using the standard protocol during bolus administration of intravenous contrast. Multiplanar reconstructed images and MIPs were obtained and reviewed to evaluate the vascular anatomy. CONTRAST:  100 cc Isovue 370 COMPARISON:  Chest radiograph dated 10/09/2017. FINDINGS: CTA CHEST FINDINGS Cardiovascular: There is no cardiomegaly or pericardial effusion. The thoracic aorta is unremarkable. There is no aneurysmal dilatation or evidence of dissection. The origins of the great vessels of the aortic arch appear patent. There is no CT evidence of pulmonary embolism. Mediastinum/Nodes: There is no hilar or mediastinal adenopathy. The esophagus and the thyroid gland are grossly unremarkable. Lungs/Pleura: There is moderate to severe centrilobular emphysema. There is a 4 mm nodule in the left upper lobe (series 9, image 42). A 7 mm nodule in the right lower lobe (series 9, image 91) as well as smaller right lower lobe nodules noted. There is no focal consolidation, pleural effusion, or pneumothorax. The central airways are patent. Musculoskeletal:  No acute osseous pathology. Review of the MIP images confirms the above findings. CTA ABDOMEN AND PELVIS FINDINGS VASCULAR Aorta: Mild atherosclerotic disease. No aneurysmal dilatation or evidence of dissection. No periaortic inflammatory  changes. Celiac: Patent without evidence of aneurysm, dissection, vasculitis or significant stenosis. SMA: Patent without evidence of aneurysm, dissection, vasculitis or significant stenosis. Renals: Both renal arteries are patent without evidence of aneurysm, dissection, vasculitis, fibromuscular dysplasia or significant stenosis. IMA: Patent without evidence of aneurysm, dissection, vasculitis or significant stenosis. Inflow: Patent without evidence of aneurysm, dissection, vasculitis or significant stenosis. Veins: No obvious venous abnormality within the limitations of this arterial phase study. Review of the MIP images confirms the above findings. NON-VASCULAR Hepatobiliary: There is mild diffuse fatty infiltration of the liver. No intrahepatic biliary ductal dilatation. The gallbladder is predominantly contracted but appears unremarkable. Pancreas: Unremarkable. No pancreatic ductal dilatation or surrounding inflammatory changes. Spleen: Normal in size without focal abnormality. Adrenals/Urinary Tract: Adrenal glands are unremarkable. Kidneys are normal, without renal calculi, focal lesion, or hydronephrosis. Bladder is unremarkable. Stomach/Bowel: Small scattered sigmoid diverticula. No bowel obstruction or active inflammation. Normal appendix. Lymphatic: No adenopathy. Reproductive: The prostate and seminal vesicles are grossly unremarkable. Other: No abdominal wall hernia or abnormality. No abdominopelvic ascites. Musculoskeletal: Mild degenerative changes of the spine. No acute osseous pathology. Review of the MIP images confirms the above findings. IMPRESSION: 1. No acute intrathoracic, abdominal, or pelvic pathology. No aortic dissection or aneurysm. No CT evidence of  pulmonary embolism. 2. Emphysema.  No pneumothorax. 3. Scattered bilateral pulmonary nodules measure up to 7 mm in the right lower lobe. Non-contrast chest CT at 3-6 months is recommended. If the nodules are stable at time of repeat CT,  then future CT at 18-24 months (from today's scan) is considered optional for low-risk patients, but is recommended for high-risk patients. This recommendation follows the consensus statement: Guidelines for Management of Incidental Pulmonary Nodules Detected on CT Images: From the Fleischner Society 2017; Radiology 2017; 284:228-243. Electronically Signed   By: Elgie CollardArash  Radparvar M.D.   On: 10/09/2017 21:43    Procedures Procedures (including critical care time)  Medications Ordered in ED Medications  ipratropium-albuterol (DUONEB) 0.5-2.5 (3) MG/3ML nebulizer solution 3 mL (3 mLs Nebulization Given 10/09/17 1855)  albuterol (PROVENTIL) (2.5 MG/3ML) 0.083% nebulizer solution 2.5 mg (2.5 mg Nebulization Given 10/09/17 1855)  iopamidol (ISOVUE-370) 76 % injection 100 mL (100 mLs Intravenous Contrast Given 10/09/17 2058)  predniSONE (DELTASONE) tablet 60 mg (60 mg Oral Given 10/09/17 2159)     Initial Impression / Assessment and Plan / ED Course  I have reviewed the triage vital signs and the nursing notes.  Pertinent labs & imaging results that were available during my care of the patient were reviewed by me and considered in my medical decision making (see chart for details).     Patient improved with neb treatment.  He will will be discharged with prednisone with COPD exacerbation  Final Clinical Impressions(s) / ED Diagnoses   Final diagnoses:  COPD exacerbation [facility](HCC)    ED Discharge Orders        Ordered    predniSONE (DELTASONE) 20 MG tablet     10/09/17 2154       Bethann BerkshireZammit, Elianny Buxbaum, MD 10/10/17 2134

## 2018-06-18 ENCOUNTER — Emergency Department (HOSPITAL_COMMUNITY): Payer: Self-pay

## 2018-06-18 ENCOUNTER — Encounter (HOSPITAL_COMMUNITY): Payer: Self-pay | Admitting: *Deleted

## 2018-06-18 ENCOUNTER — Emergency Department (HOSPITAL_COMMUNITY)
Admission: EM | Admit: 2018-06-18 | Discharge: 2018-06-18 | Disposition: A | Discharge status: Home / Self Care | Payer: Self-pay | Attending: Emergency Medicine | Admitting: Emergency Medicine

## 2018-06-18 ENCOUNTER — Other Ambulatory Visit: Payer: Self-pay

## 2018-06-18 DIAGNOSIS — Z720 Tobacco use: Secondary | ICD-10-CM

## 2018-06-18 DIAGNOSIS — R918 Other nonspecific abnormal finding of lung field: Secondary | ICD-10-CM | POA: Insufficient documentation

## 2018-06-18 DIAGNOSIS — J441 Chronic obstructive pulmonary disease with (acute) exacerbation: Secondary | ICD-10-CM | POA: Insufficient documentation

## 2018-06-18 DIAGNOSIS — F17200 Nicotine dependence, unspecified, uncomplicated: Secondary | ICD-10-CM | POA: Insufficient documentation

## 2018-06-18 HISTORY — DX: Chronic obstructive pulmonary disease, unspecified: J44.9

## 2018-06-18 LAB — BASIC METABOLIC PANEL
Anion gap: 4 — ABNORMAL LOW (ref 5–15)
BUN: 14 mg/dL (ref 6–20)
CHLORIDE: 106 mmol/L (ref 98–111)
CO2: 29 mmol/L (ref 22–32)
Calcium: 8.9 mg/dL (ref 8.9–10.3)
Creatinine, Ser: 0.93 mg/dL (ref 0.61–1.24)
GFR calc Af Amer: >60 mL/min (ref >60)
GFR calc non Af Amer: >60 mL/min (ref >60)
GLUCOSE: 99 mg/dL (ref 70–99)
Potassium: 4.5 mmol/L (ref 3.5–5.1)
Sodium: 139 mmol/L (ref 135–145)

## 2018-06-18 LAB — CBC WITH DIFFERENTIAL/PLATELET
Basophils Absolute: 0 10*3/uL (ref 0.0–0.1)
Basophils Relative: 0 %
EOS PCT: 3 %
Eosinophils Absolute: 0.2 10*3/uL (ref 0.0–0.7)
HEMATOCRIT: 39.1 % (ref 39.0–52.0)
Hemoglobin: 13.1 g/dL (ref 13.0–17.0)
LYMPHS ABS: 2.8 10*3/uL (ref 0.7–4.0)
Lymphocytes Relative: 30 %
MCH: 31.8 pg (ref 26.0–34.0)
MCHC: 33.5 g/dL (ref 30.0–36.0)
MCV: 94.9 fL (ref 78.0–100.0)
MONO ABS: 0.7 10*3/uL (ref 0.1–1.0)
Monocytes Relative: 7 %
Neutro Abs: 5.7 10*3/uL (ref 1.7–7.7)
Neutrophils Relative %: 60 %
PLATELETS: 278 10*3/uL (ref 150–400)
RBC: 4.12 MIL/uL — ABNORMAL LOW (ref 4.22–5.81)
RDW: 13.7 % (ref 11.5–15.5)
WBC: 9.5 10*3/uL (ref 4.0–10.5)

## 2018-06-18 IMAGING — DX DG CHEST 2V
2 series · 2 of 2 positions shown · non-contrast
Comparison: Chest CT [DATE]

CLINICAL DATA: Shortness of breath

EXAM:
CHEST - 2 VIEW

[chest pa]
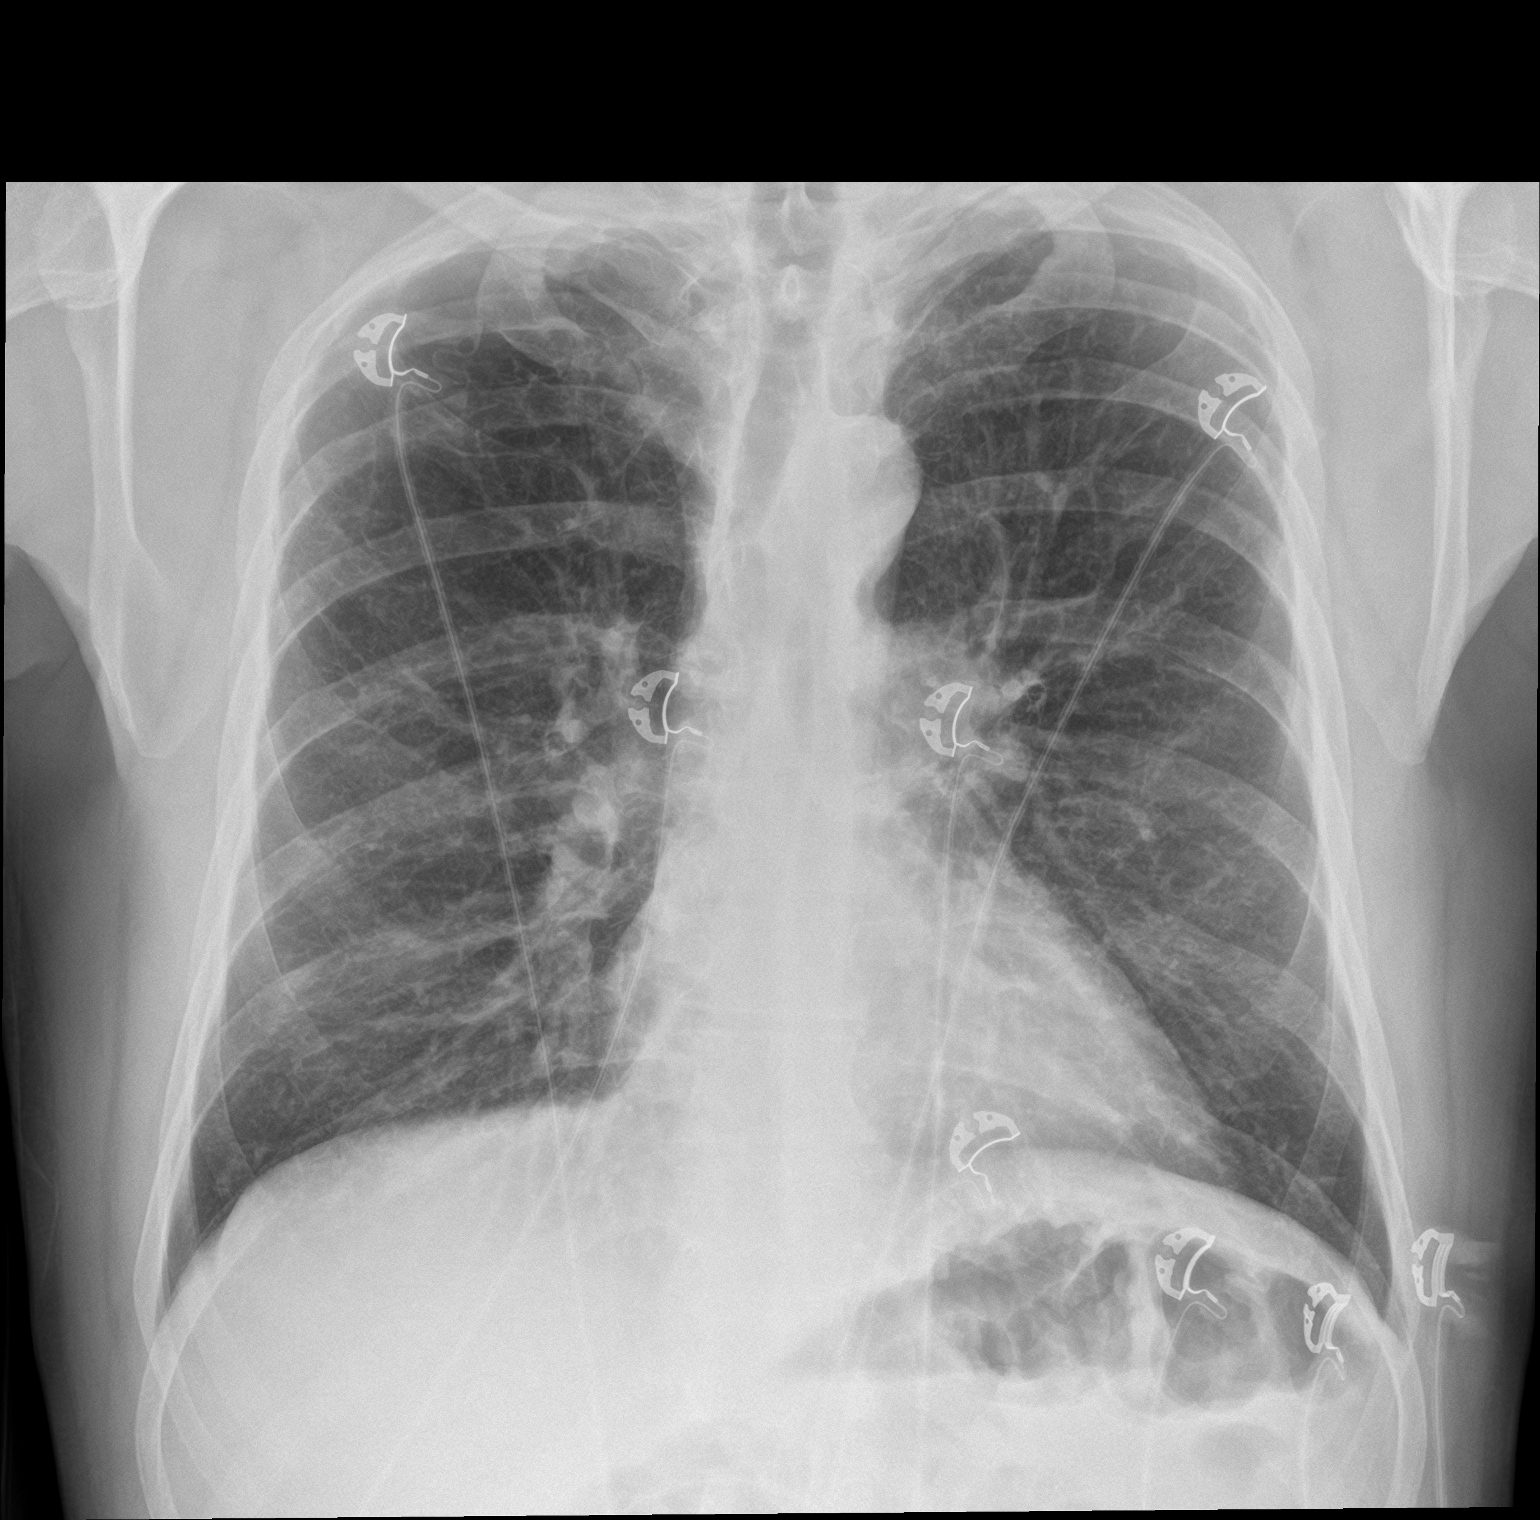

[chest lat]
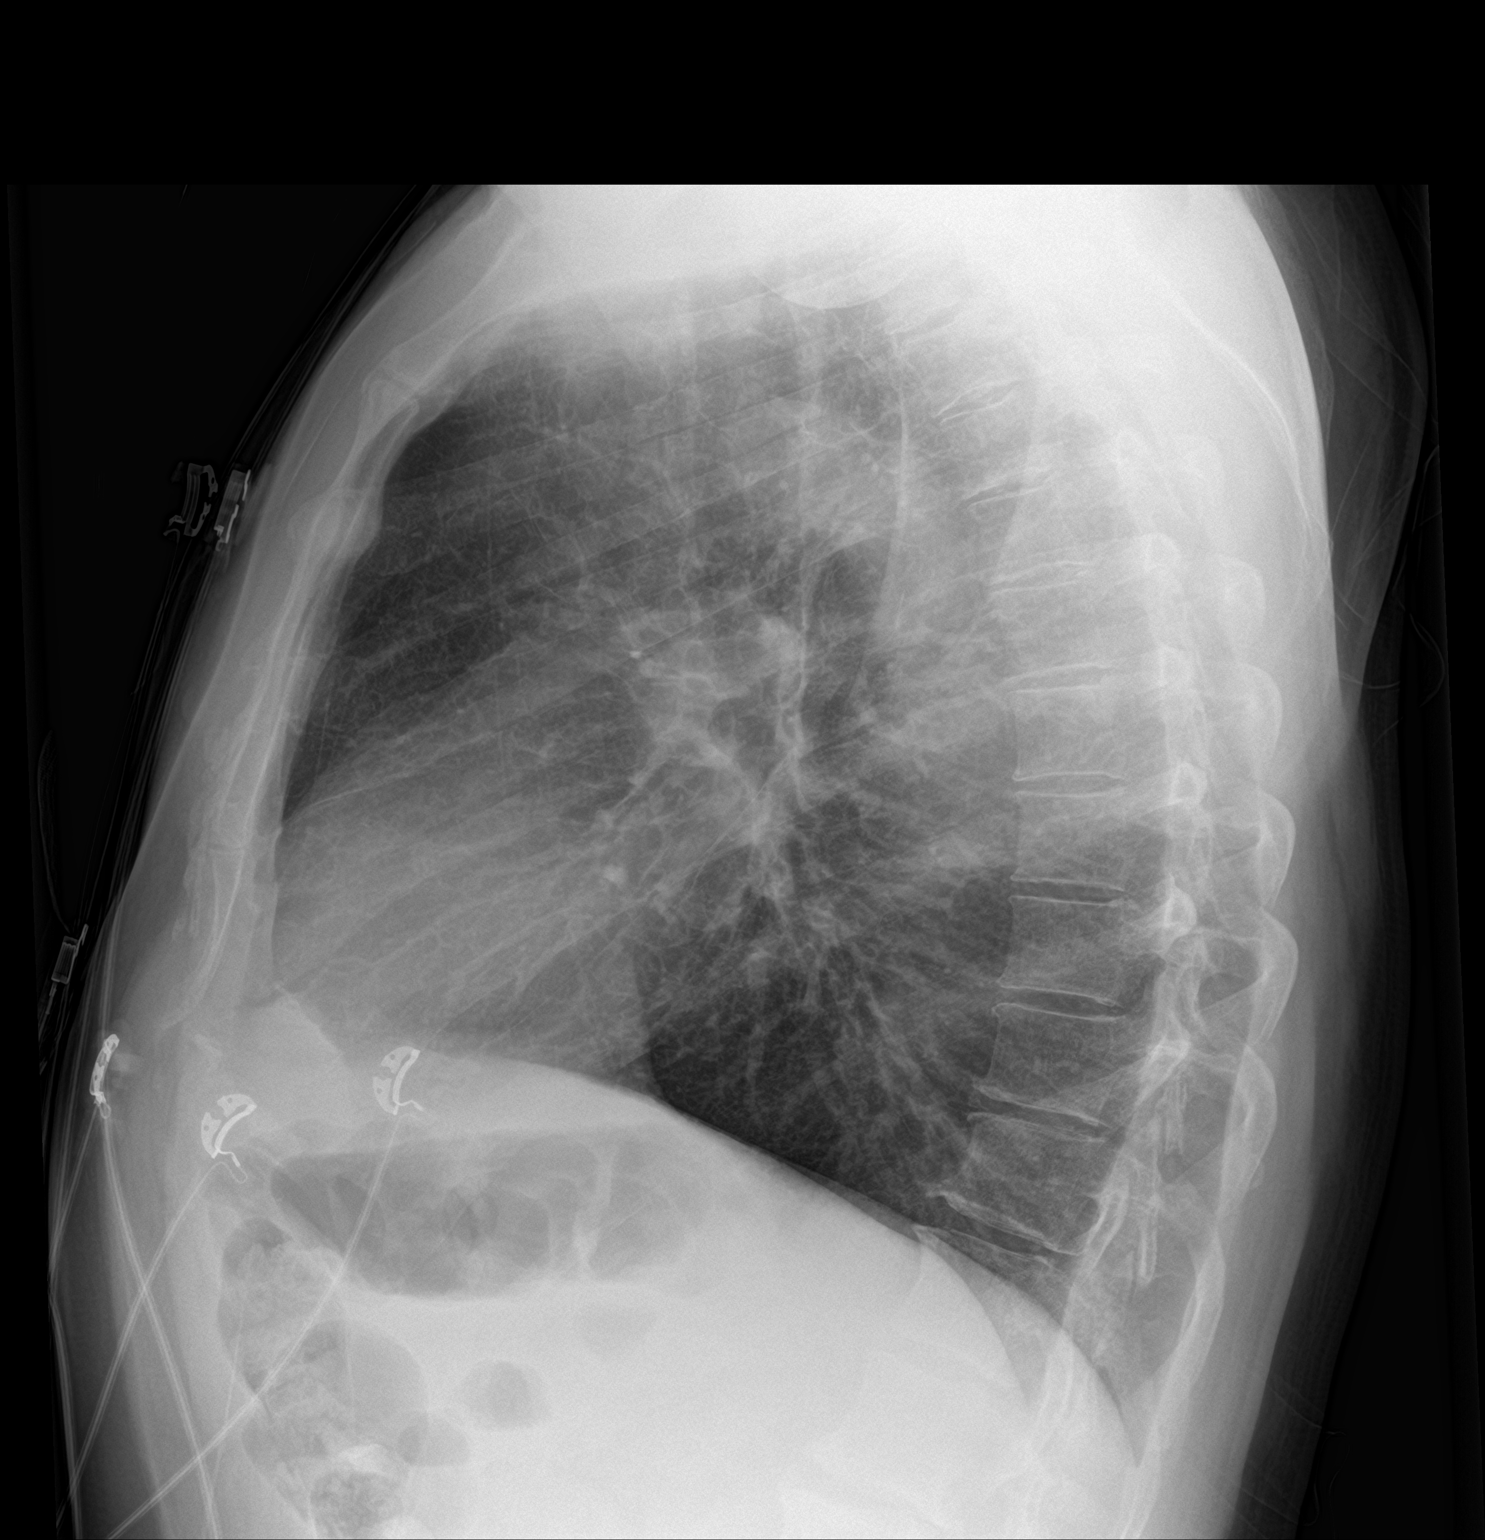

[2 of 2 positions shown; findings below may reference images not displayed]

FINDINGS: The lungs are hyperinflated with diffuse interstitial prominence. No
focal airspace consolidation or pulmonary edema. No pleural effusion
or pneumothorax. Normal cardiomediastinal contours.
IMPRESSION: COPD without acute airspace disease.

## 2018-06-18 MED ORDER — AEROCHAMBER PLUS FLO-VU MEDIUM MISC
1.0000 | Freq: Once | Status: AC
  Administered 2018-06-18: 1
  Filled 2018-06-18 (×2): qty 1

## 2018-06-18 MED ORDER — DOXYCYCLINE HYCLATE 100 MG PO CAPS: 100.0000 mg | ORAL_CAPSULE | Freq: Two times a day (BID) | ORAL | 0 refills | Status: DC

## 2018-06-18 MED ORDER — ALBUTEROL (5 MG/ML) CONTINUOUS INHALATION SOLN
10.0000 mg/h | INHALATION_SOLUTION | Freq: Once | RESPIRATORY_TRACT | Status: AC
  Administered 2018-06-18: 10 mg/h via RESPIRATORY_TRACT
  Filled 2018-06-18: qty 20

## 2018-06-18 MED ORDER — PREDNISONE 50 MG PO TABS
60.0000 mg | ORAL_TABLET | Freq: Once | ORAL | Status: AC
  Administered 2018-06-18: 60 mg via ORAL
  Filled 2018-06-18: qty 1

## 2018-06-18 MED ORDER — ALBUTEROL SULFATE HFA 108 (90 BASE) MCG/ACT IN AERS
2.0000 | INHALATION_SPRAY | Freq: Once | RESPIRATORY_TRACT | Status: AC
  Administered 2018-06-18: 2 via RESPIRATORY_TRACT
  Filled 2018-06-18: qty 6.7

## 2018-06-18 MED ORDER — PREDNISONE 20 MG PO TABS: 20.0000 mg | ORAL_TABLET | Freq: Two times a day (BID) | ORAL | 0 refills | Status: DC

## 2018-06-18 NOTE — ED Triage Notes (Signed)
Short of breath for over a year, worse past few days

## 2018-06-18 NOTE — ED Provider Notes (Signed)
[facility] EMERGENCY DEPARTMENT Provider Note   CSN: [id number] Arrival date & time: 06/18/18  2115     History   Chief Complaint Chief Complaint  Patient presents with  . Shortness of Breath    HPI Justin Price is a 57 y.o. male.  HPI  Patient with chronic shortness of breath presenting for worsening shortness of breath for 3 days.  He has history of COPD.  Last ED visit, December 2018 he was treated for COPD exacerbation with prednisone.  Patient is a smoker.  He did not follow-up with a physician following his last ED visit.  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  He has occasional left-sided headaches, not currently.  He denies chest pain, abdominal pain, weakness or paresthesia.  He is here with 2 family members.  There are no other known modifying factors.  Past Medical History:  Diagnosis Date  . COPD (chronic obstructive pulmonary disease) (HCC)     There are no active problems to display for this patient.   Past Surgical History:  Procedure Laterality Date  . ORTHOPEDIC SURGERY    . vascetomy          Home Medications    Prior to Admission medications   Medication Sig Start Date End Date Taking? Authorizing Provider  ibuprofen (ADVIL,MOTRIN) 200 MG tablet Take 400 mg by mouth every 6 (six) hours as needed for moderate pain.   Yes [provider]  doxycycline (VIBRAMYCIN) 100 MG capsule Take 1 capsule (100 mg total) by mouth 2 (two) times daily. 06/18/18   Mancel Bale, MD  predniSONE (DELTASONE) 20 MG tablet Take 1 tablet (20 mg total) by mouth 2 (two) times daily. 06/18/18   Mancel Bale, MD    Family History No family history on file.  Social History Social History   Tobacco Use  . Smoking status: Current Every Day Smoker    Packs/day: 2.00  . Smokeless tobacco: Never Used  Substance Use Topics  . Alcohol use: Yes    Alcohol/week: 3.0 oz    Types: 5 Cans of  beer per week  . Drug use: Not on file     Allergies   Patient has no known allergies.   Review of Systems Review of Systems  All other systems reviewed and are negative.    Physical Exam Updated Vital Signs BP 110/73   Pulse 83   Temp 98 F (36.7 C) (Oral)   Resp 13   Ht 5\' 10"  (1.778 m)   Wt 80.9 kg (178 lb 5 oz)   SpO2 100%   BMI 25.59 kg/m   Physical Exam  Constitutional: He is oriented to person, place, and time. He appears well-developed. He does not appear ill.  He appears older than stated age.  HENT:  Head: Normocephalic and atraumatic.  Right Ear: External ear normal.  Left Ear: External ear normal.  Eyes: Pupils are equal, round, and reactive to light. Conjunctivae and EOM are normal.  Neck: Normal range of motion and phonation normal. Neck supple.  Cardiovascular: Normal rate, regular rhythm and normal heart sounds.  Pulmonary/Chest: Effort normal. No accessory muscle usage. No apnea and no tachypnea. No respiratory distress. He has decreased breath sounds (Mild, diffuse). He has no wheezes. He has no rales. He exhibits no bony tenderness.  Abdominal: Soft. There is no tenderness.  Musculoskeletal: Normal range of motion.       Right  lower leg: He exhibits no tenderness and no edema.       Left lower leg: He exhibits no tenderness and no edema.  Neurological: He is alert and oriented to person, place, and time. No cranial nerve deficit or sensory deficit. He exhibits normal muscle tone. Coordination normal.  Skin: Skin is warm, dry and intact.  Psychiatric: He has a normal mood and affect. His behavior is normal. Judgment and thought content normal.  Nursing note and vitals reviewed.    ED Treatments / Results  Labs (all labs ordered are listed, but only abnormal results are displayed) Labs Reviewed  BASIC METABOLIC PANEL - Abnormal; Notable for the following components:      Result Value   Anion gap 4 (*)    All other components within normal  limits  CBC WITH DIFFERENTIAL/PLATELET - Abnormal; Notable for the following components:   RBC 4.12 (*)    All other components within normal limits    EKG None  Radiology Dg Chest 2 View  Result Date: 06/18/2018 CLINICAL DATA:  Shortness of breath EXAM: CHEST - 2 VIEW COMPARISON:  Chest CT 10/09/2017 FINDINGS: The lungs are hyperinflated with diffuse interstitial prominence. No focal airspace consolidation or pulmonary edema. No pleural effusion or pneumothorax. Normal cardiomediastinal contours. IMPRESSION: COPD without acute airspace disease. Electronically Signed   By: Deatra RobinsonKevin  Herman M.D.   On: 06/18/2018 22:22    Procedures Procedures (including critical care time)  Medications Ordered in ED Medications  albuterol (PROVENTIL HFA;VENTOLIN HFA) 108 (90 Base) MCG/ACT inhaler 2 puff (has no administration in time range)  AEROCHAMBER PLUS FLO-VU MEDIUM MISC 1 each (has no administration in time range)  albuterol (PROVENTIL,VENTOLIN) solution continuous neb (10 mg/hr Nebulization Given 06/18/18 2230)  predniSONE (DELTASONE) tablet 60 mg (60 mg Oral Given 06/18/18 2201)     Initial Impression / Assessment and Plan / ED Course  I have reviewed the triage vital signs and the nursing notes.  Pertinent labs & imaging results that were available during my care of the patient were reviewed by me and considered in my medical decision making (see chart for details).      Patient Vitals for the past 24 hrs:  BP Temp Temp src Pulse Resp SpO2 Height Weight  06/18/18 2300 110/73 - - 83 13 100 % - -  06/18/18 2230 - - - - - 99 % - -  06/18/18 2128 - - - - - - 5\' 10"  (1.778 m) 80.9 kg (178 lb 5 oz)  06/18/18 2127 (!) 130/96 98 F (36.7 C) Oral 88 17 95 % - -    11:21 PM Reevaluation with update and discussion. After initial assessment and treatment, an updated evaluation reveals patient states his breathing is improved, near the end of his nebulizer treatment.  Discussed with patient and  family members, all questions answered. Mancel BaleElliott Vyctoria Dickman   Medical Decision Making: Evaluation consistent with exacerbation COPD.  No evidence for pneumonia.  Patient with prior nodules visualized on CT imaging, November 2018, he has not followed up about it yet.  He is trying to get a PCP that can help him by ordering CT and follow-up on the results and arrange for further treatment for nodules and COPD.  Family members are helping him with that process.  Doubt pneumonia, metabolic instability or impending vascular collapse.  No indication for further treatment in the ED or hospital setting at this time  CRITICAL CARE-no Performed by: Mancel BaleElliott Carley Strickling   Nursing Notes Reviewed/ Care  Coordinated Applicable Imaging Reviewed Interpretation of Laboratory Data incorporated into ED treatment  The patient appears reasonably screened and/or stabilized for discharge and I doubt any other medical condition or other [facility] requiring further screening, evaluation, or treatment in the ED at this time prior to discharge.  Plan: Home Medications-albuterol inhaler, given at discharge; Home Treatments-try to stop smoking; return here if the recommended treatment, does not improve the symptoms; Recommended follow up-PCP follow-up as soon as possible for further evaluation and treatment of COPD and to follow-up on pulmonary nodule seen on CT imaging November 2018.     Final Clinical Impressions(s) / ED Diagnoses   Final diagnoses:  COPD exacerbation (HCC)  Pulmonary nodules  Tobacco abuse    ED Discharge Orders        Ordered    predniSONE (DELTASONE) 20 MG tablet  2 times daily     06/18/18 2323    doxycycline (VIBRAMYCIN) 100 MG capsule  2 times daily     06/18/18 2323       Mancel Bale, MD 06/18/18 2327

## 2018-06-18 NOTE — Discharge Instructions (Addendum)
Use the albuterol inhaler 2 puffs every 3-4 hours as needed for cough or trouble breathing.  Start the prescriptions of prednisone and doxycycline, tomorrow.  These are for inflammation and infection, respectively.  Make sure you are drinking plenty of water.  It is important to stop smoking cigarettes as soon as possible.  The attached resource guide to help you find a doctor for further care and treatment.  They can work with you on scheduling the CT to evaluate the pulmonary nodules and also get you help with smoking cessation.  It is important to see the doctor as soon as you can hopefully within the next 2 weeks, to expedite your care.

## 2019-06-28 ENCOUNTER — Other Ambulatory Visit: Payer: Self-pay | Admitting: Adult Health

## 2019-06-28 ENCOUNTER — Other Ambulatory Visit (HOSPITAL_COMMUNITY): Payer: Self-pay | Admitting: Adult Health

## 2019-06-28 DIAGNOSIS — J449 Chronic obstructive pulmonary disease, unspecified: Secondary | ICD-10-CM

## 2019-06-28 DIAGNOSIS — R0602 Shortness of breath: Secondary | ICD-10-CM

## 2019-08-21 ENCOUNTER — Encounter (HOSPITAL_COMMUNITY): Payer: Self-pay

## 2019-08-21 ENCOUNTER — Other Ambulatory Visit (HOSPITAL_COMMUNITY): Payer: Self-pay | Admitting: Adult Health

## 2019-08-21 ENCOUNTER — Ambulatory Visit (HOSPITAL_COMMUNITY)
Admission: RE | Admit: 2019-08-21 | Discharge: 2019-08-21 | Disposition: A | Discharge status: Home / Self Care | Payer: Medicaid Other | Source: Ambulatory Visit | Attending: Adult Health | Admitting: Adult Health

## 2019-08-21 ENCOUNTER — Other Ambulatory Visit: Payer: Self-pay

## 2019-08-21 DIAGNOSIS — R9389 Abnormal findings on diagnostic imaging of other specified body structures: Secondary | ICD-10-CM | POA: Insufficient documentation

## 2019-08-21 DIAGNOSIS — R0602 Shortness of breath: Secondary | ICD-10-CM

## 2019-08-21 IMAGING — DX DG CHEST 2V
3 series · 3 of 3 positions shown · non-contrast
Comparison: Radiograph [DATE], CT [DATE]

CLINICAL DATA: Shortness of breath, follow-up x-ray

EXAM:
CHEST - 2 VIEW

[chest pa (1 of 2)]
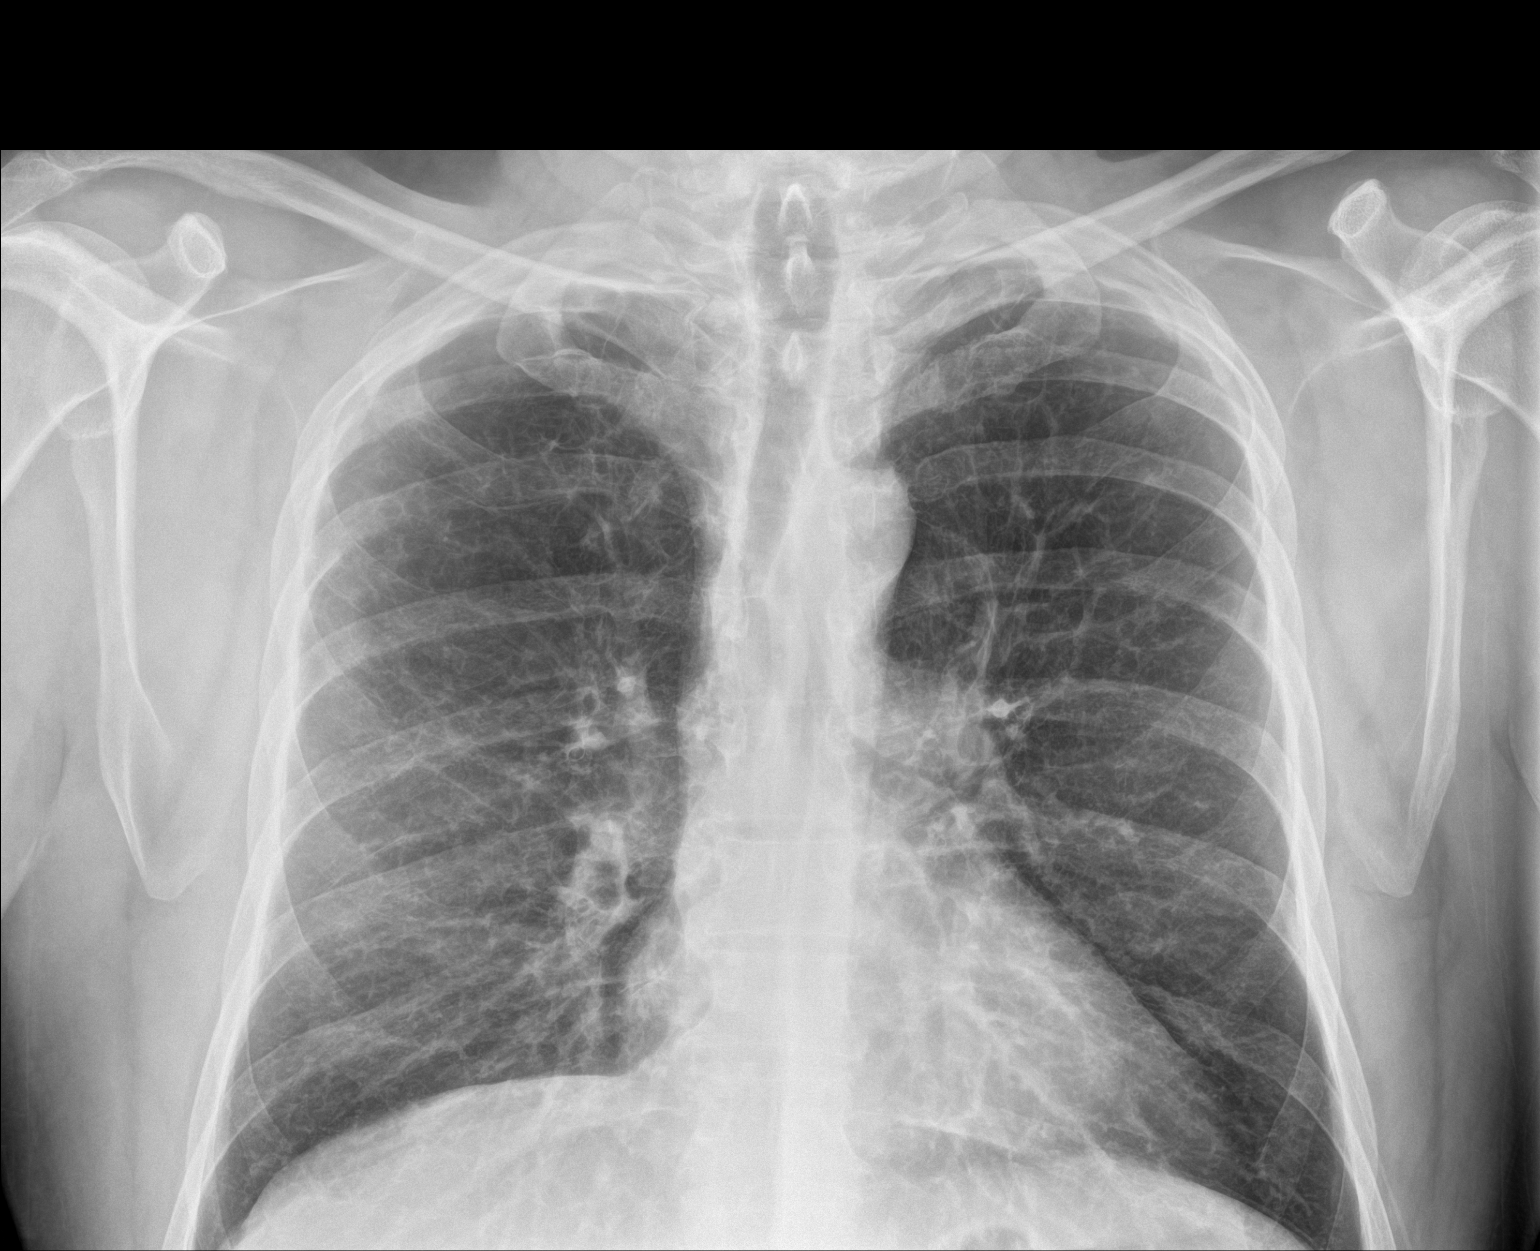

[chest pa (2 of 2)]
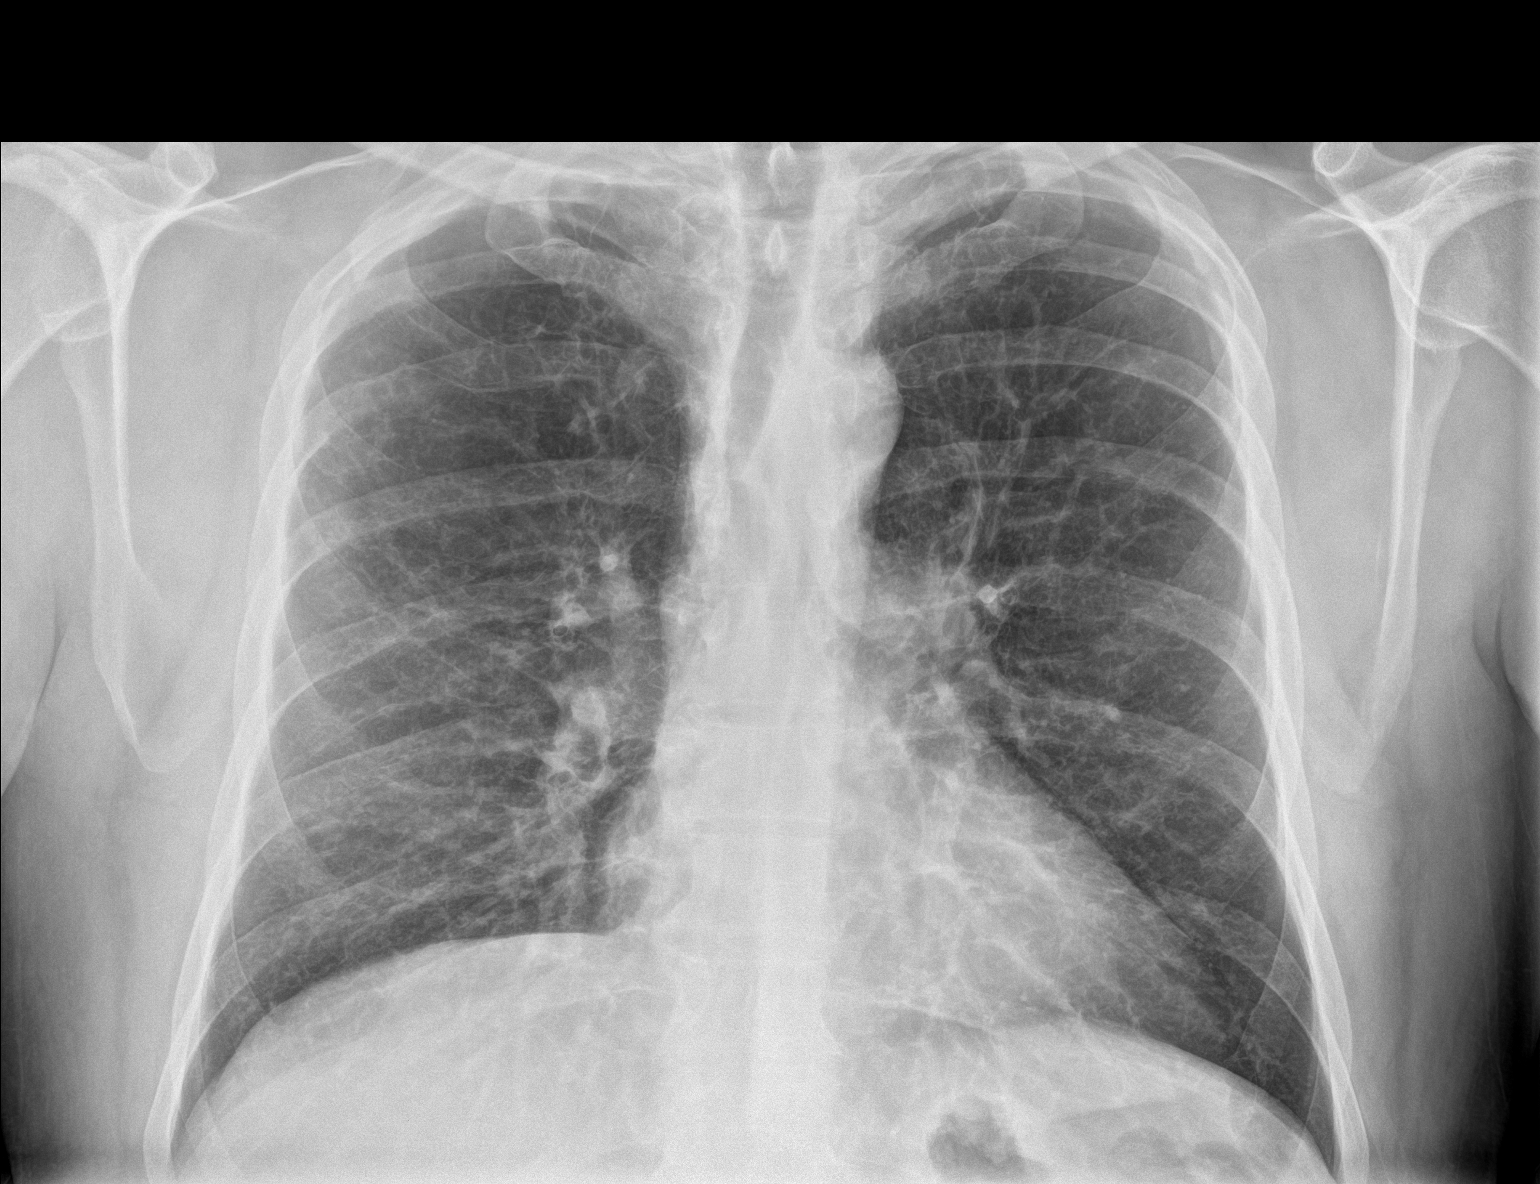

[chest lat]
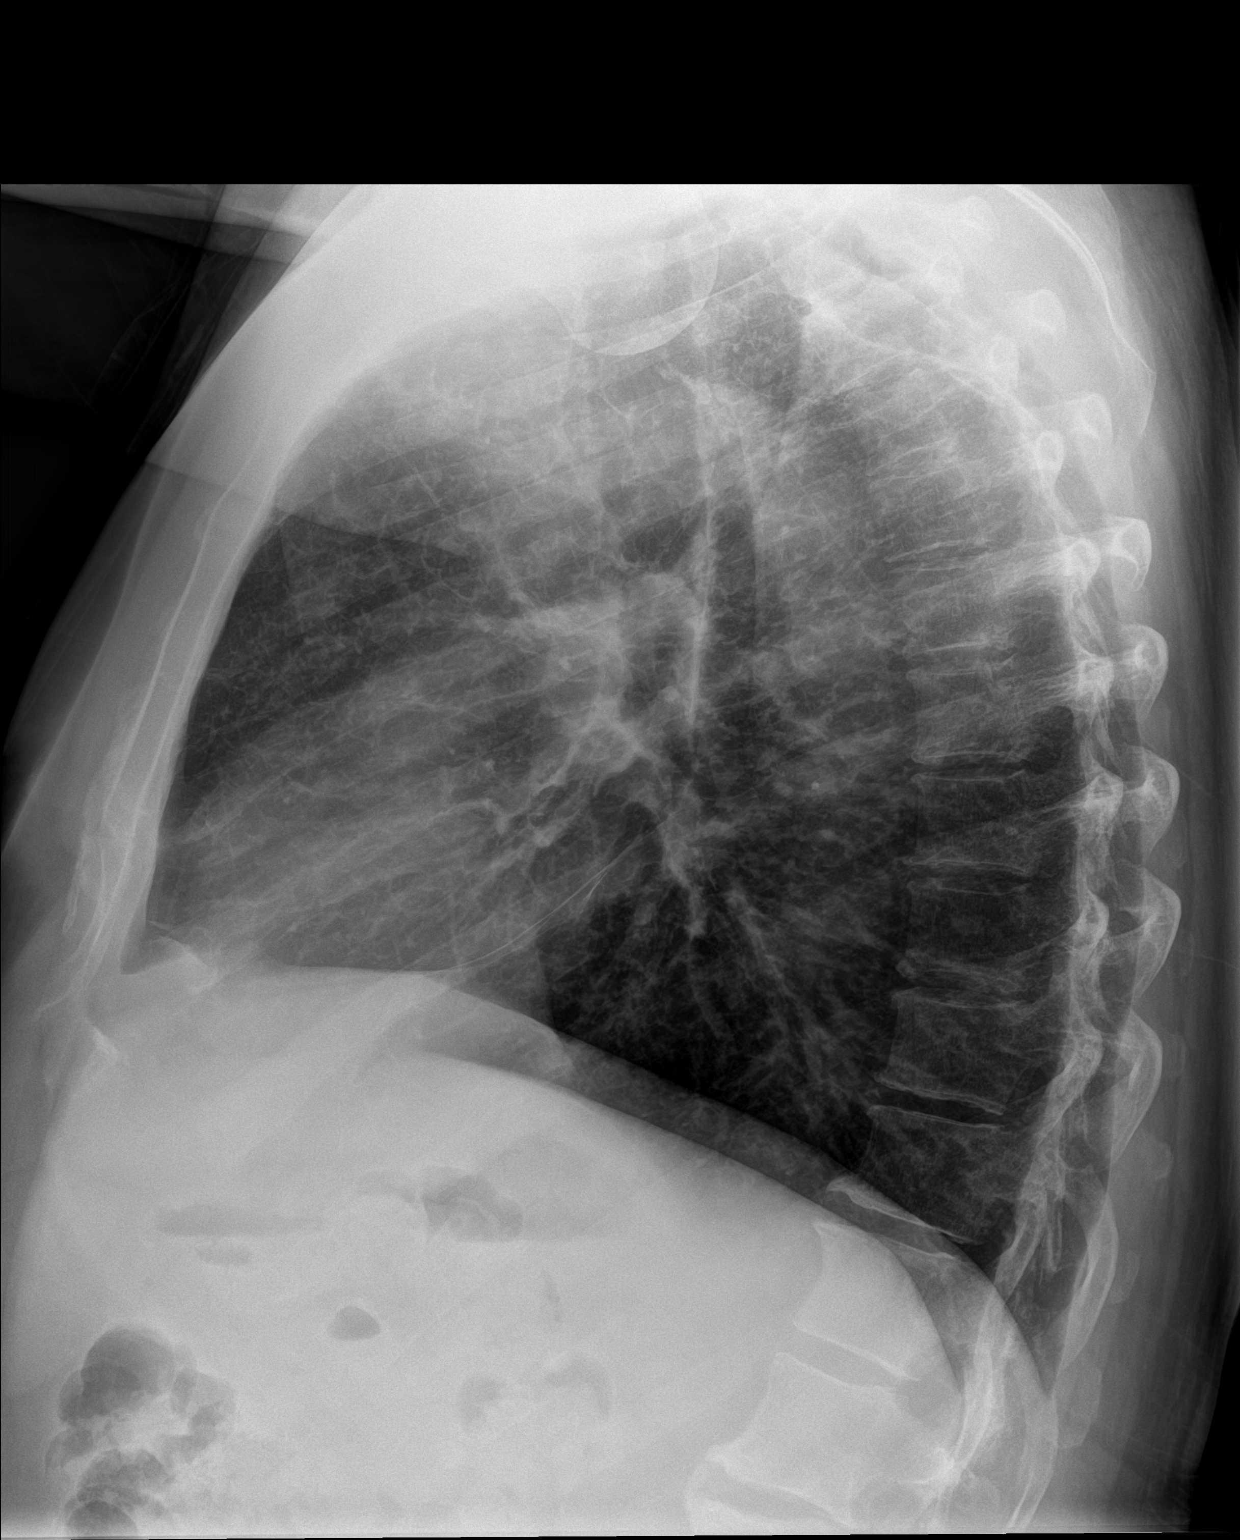

[3 of 3 positions shown; findings below may reference images not displayed]

FINDINGS: Coarse interstitial opacities are present with a pickle lucency
likely reflecting emphysematous changes seen on prior
cross-sectional imaging. Question a region more focal opacity in the
right upper lobe. Could reflect confluent scarring versus infection.
No pneumothorax or visible effusion. The cardiomediastinal contours
are unremarkable. Degenerative changes are present in the imaged
spine and shoulders. No acute osseous or soft tissue abnormality
IMPRESSION: 1. Question a more focal opacity in the right upper lobe. Could
reflect confluent scarring versus infection. Recommend follow-up
radiographs following therapeutic intervention to ensure for
resolution.
2. Chronic interstitial changes and emphysema.

## 2019-12-05 ENCOUNTER — Other Ambulatory Visit: Payer: Self-pay | Admitting: Family Medicine

## 2019-12-05 ENCOUNTER — Other Ambulatory Visit (HOSPITAL_COMMUNITY): Payer: Self-pay | Admitting: Family Medicine

## 2019-12-05 DIAGNOSIS — J439 Emphysema, unspecified: Secondary | ICD-10-CM

## 2019-12-05 DIAGNOSIS — J449 Chronic obstructive pulmonary disease, unspecified: Secondary | ICD-10-CM

## 2020-01-13 ENCOUNTER — Emergency Department (HOSPITAL_COMMUNITY): Payer: Medicaid Other

## 2020-01-13 ENCOUNTER — Emergency Department (HOSPITAL_COMMUNITY)
Admission: EM | Admit: 2020-01-13 | Discharge: 2020-01-13 | Discharge status: Against Medical Advice | Payer: Medicaid Other | Attending: Emergency Medicine | Admitting: Emergency Medicine

## 2020-01-13 ENCOUNTER — Other Ambulatory Visit: Payer: Self-pay

## 2020-01-13 ENCOUNTER — Encounter (HOSPITAL_COMMUNITY): Payer: Self-pay

## 2020-01-13 DIAGNOSIS — F17210 Nicotine dependence, cigarettes, uncomplicated: Secondary | ICD-10-CM | POA: Diagnosis not present

## 2020-01-13 DIAGNOSIS — Z5320 Procedure and treatment not carried out because of patient's decision for unspecified reasons: Secondary | ICD-10-CM | POA: Diagnosis not present

## 2020-01-13 DIAGNOSIS — Z20822 Contact with and (suspected) exposure to covid-19: Secondary | ICD-10-CM | POA: Diagnosis not present

## 2020-01-13 DIAGNOSIS — R490 Dysphonia: Secondary | ICD-10-CM | POA: Diagnosis not present

## 2020-01-13 DIAGNOSIS — J449 Chronic obstructive pulmonary disease, unspecified: Secondary | ICD-10-CM | POA: Diagnosis not present

## 2020-01-13 DIAGNOSIS — R479 Unspecified speech disturbances: Secondary | ICD-10-CM | POA: Diagnosis present

## 2020-01-13 LAB — CBC WITH DIFFERENTIAL/PLATELET
Abs Immature Granulocytes: 0.04 10*3/uL (ref 0.00–0.07)
Basophils Absolute: 0 10*3/uL (ref 0.0–0.1)
Basophils Relative: 0 %
Eosinophils Absolute: 0.1 10*3/uL (ref 0.0–0.5)
Eosinophils Relative: 2 %
HCT: 40.5 % (ref 39.0–52.0)
Hemoglobin: 13.6 g/dL (ref 13.0–17.0)
Immature Granulocytes: 0 %
Lymphocytes Relative: 19 %
Lymphs Abs: 1.8 10*3/uL (ref 0.7–4.0)
MCH: 31.6 pg (ref 26.0–34.0)
MCHC: 33.6 g/dL (ref 30.0–36.0)
MCV: 94 fL (ref 80.0–100.0)
Monocytes Absolute: 0.6 10*3/uL (ref 0.1–1.0)
Monocytes Relative: 6 %
Neutro Abs: 6.8 10*3/uL (ref 1.7–7.7)
Neutrophils Relative %: 73 %
Platelets: 283 10*3/uL (ref 150–400)
RBC: 4.31 MIL/uL (ref 4.22–5.81)
RDW: 13.2 % (ref 11.5–15.5)
WBC: 9.5 10*3/uL (ref 4.0–10.5)
nRBC: 0 % (ref 0.0–0.2)

## 2020-01-13 LAB — COMPREHENSIVE METABOLIC PANEL
ALT: 29 U/L (ref 0–44)
AST: 23 U/L (ref 15–41)
Albumin: 3.9 g/dL (ref 3.5–5.0)
Alkaline Phosphatase: 72 U/L (ref 38–126)
Anion gap: 8 (ref 5–15)
BUN: 8 mg/dL (ref 6–20)
CO2: 25 mmol/L (ref 22–32)
Calcium: 8.9 mg/dL (ref 8.9–10.3)
Chloride: 103 mmol/L (ref 98–111)
Creatinine, Ser: 0.83 mg/dL (ref 0.61–1.24)
GFR calc Af Amer: >60 mL/min (ref >60)
GFR calc non Af Amer: >60 mL/min (ref >60)
Glucose, Bld: 115 mg/dL — ABNORMAL HIGH (ref 70–99)
Potassium: 4.1 mmol/L (ref 3.5–5.1)
Sodium: 136 mmol/L (ref 135–145)
Total Bilirubin: 0.6 mg/dL (ref 0.3–1.2)
Total Protein: 7.2 g/dL (ref 6.5–8.1)

## 2020-01-13 LAB — PROTIME-INR
INR: 1 (ref 0.8–1.2)
Prothrombin Time: 12.9 seconds (ref 11.4–15.2)

## 2020-01-13 IMAGING — DX DG CHEST 1V PORT
1 series · 1 of 1 positions shown · non-contrast
Comparison: [DATE]

CLINICAL DATA: Dysphagia for 2 days

EXAM:
PORTABLE CHEST 1 VIEW

[chest pa]
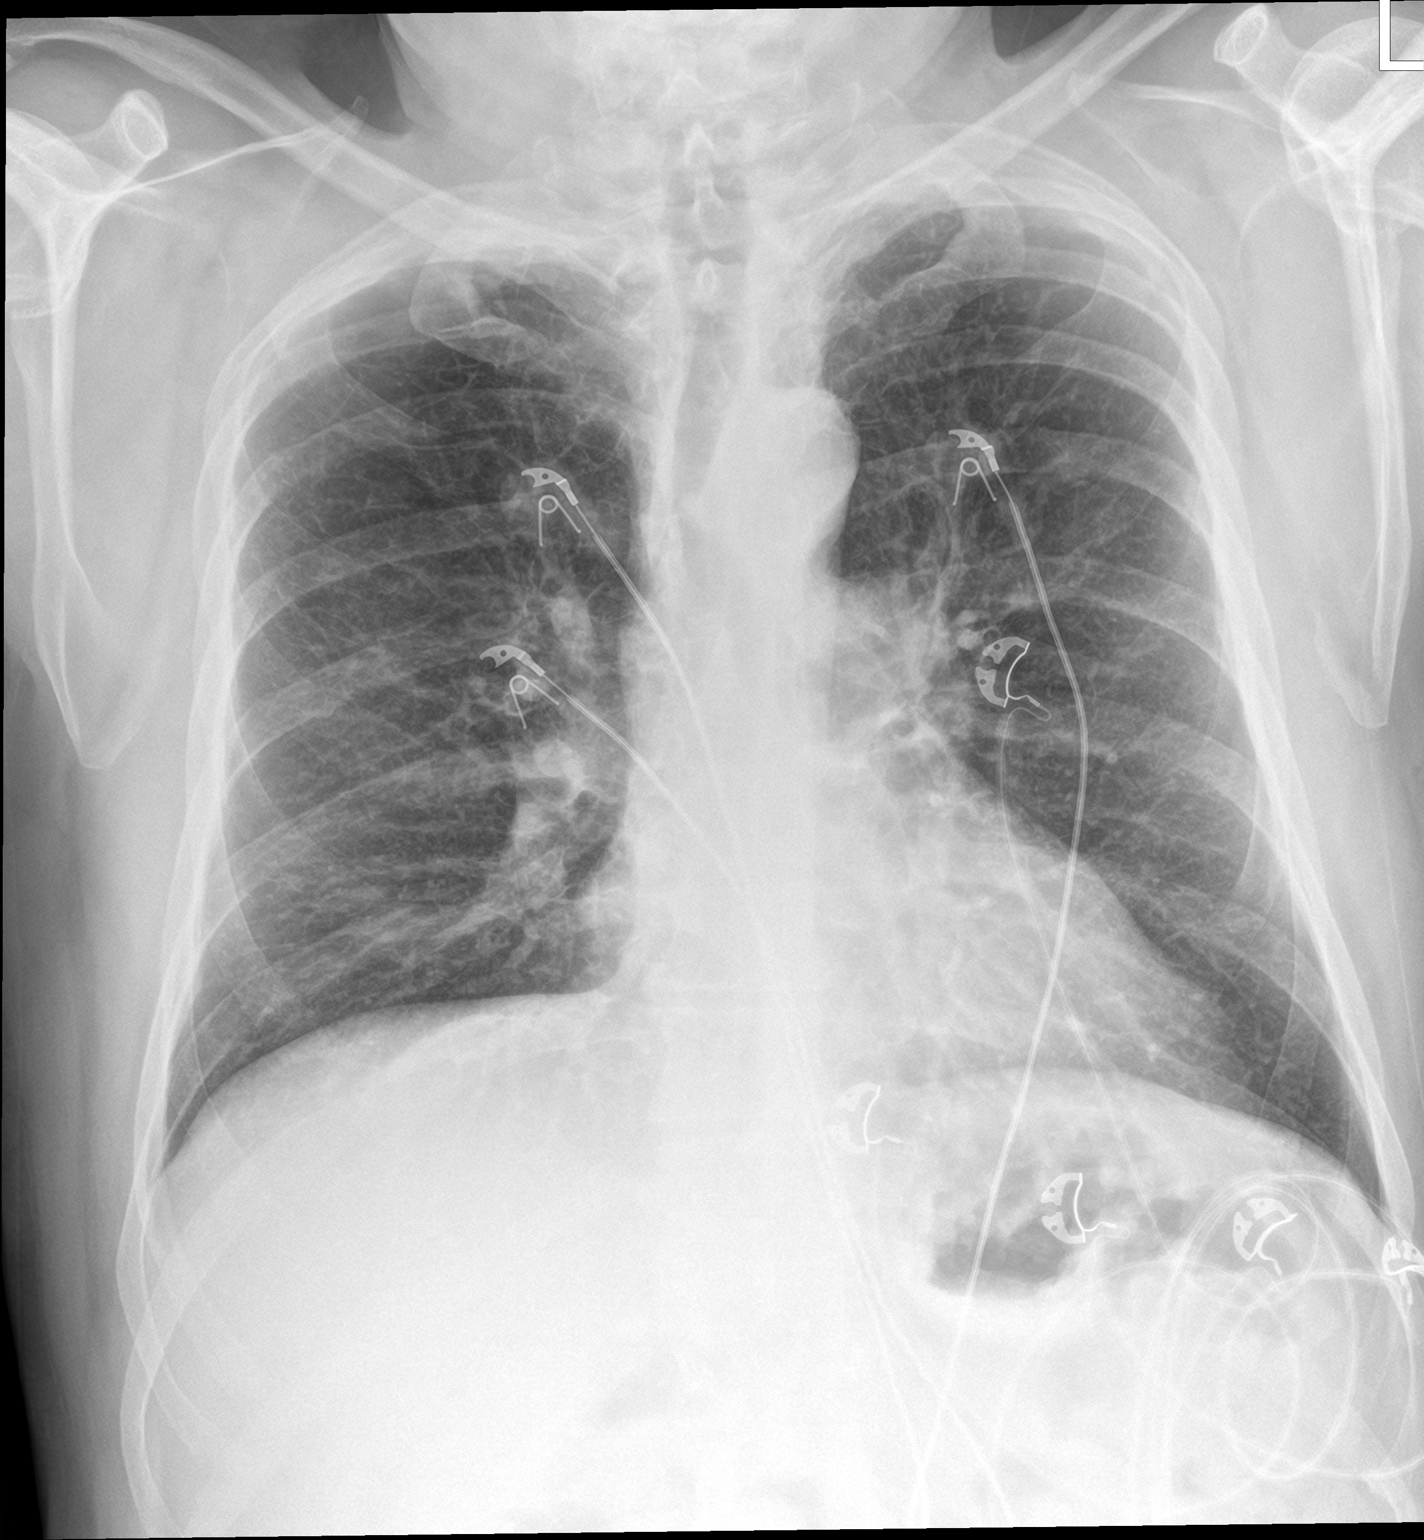

[1 of 1 positions shown; findings below may reference images not displayed]

FINDINGS: The heart size and mediastinal contours are within normal limits.
Both lungs are clear. The visualized skeletal structures are
unremarkable.
IMPRESSION: No active disease.

## 2020-01-13 IMAGING — CT CT ANGIO NECK
3 of 12 series · 7 of 34 positions shown · IV contrast (omnipaque)
Comparison: None.

CLINICAL DATA: Swallowing difficulty beginning today.

EXAM:
CT ANGIOGRAPHY HEAD AND NECK
TECHNIQUE: Multidetector CT imaging of the head and neck was performed using
the standard protocol during bolus administration of intravenous
contrast. Multiplanar CT image reconstructions and MIPs were
obtained to evaluate the vascular anatomy. Carotid stenosis
measurements (when applicable) are obtained utilizing NASCET
criteria, using the distal internal carotid diameter as the
denominator.
CONTRAST:  100mL OMNIPAQUE IOHEXOL 350 MG/ML SOLN

[Series 5: sagittal soft · sagittal · 0.32mm/px · 1 of 67 slices shown]
[im 11/67  soft-tissue]
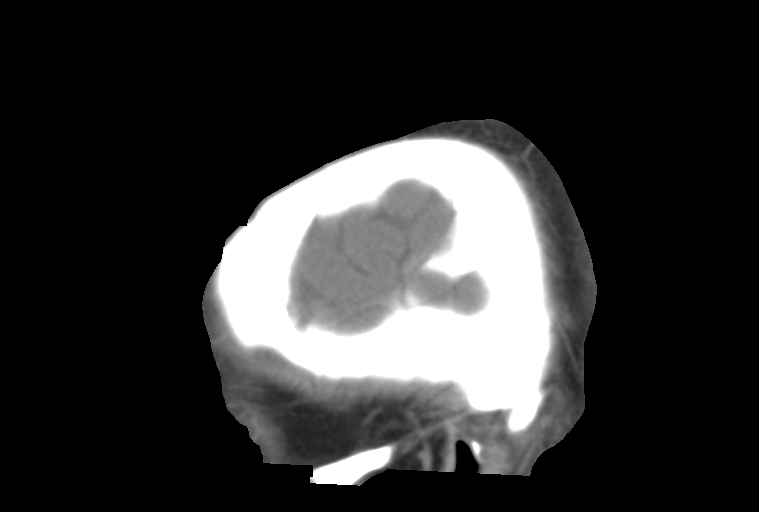

[Series 7: cta head & neck · axial · 0.49mm/px · z∈[+1346,+1558]mm · 4 of 707 slices shown]
[im 142/707  soft-tissue]
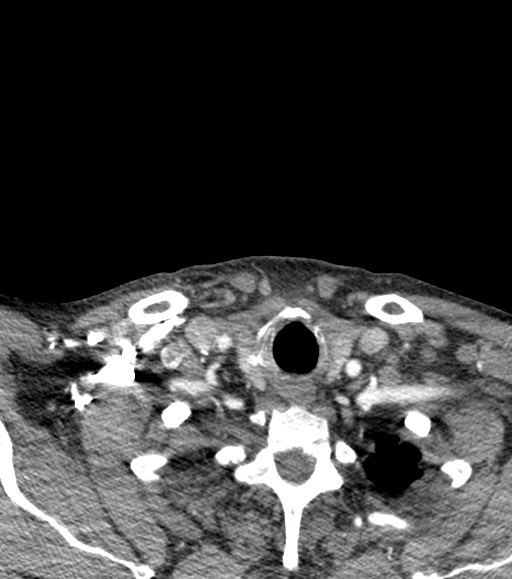
[im 283/707  bone]
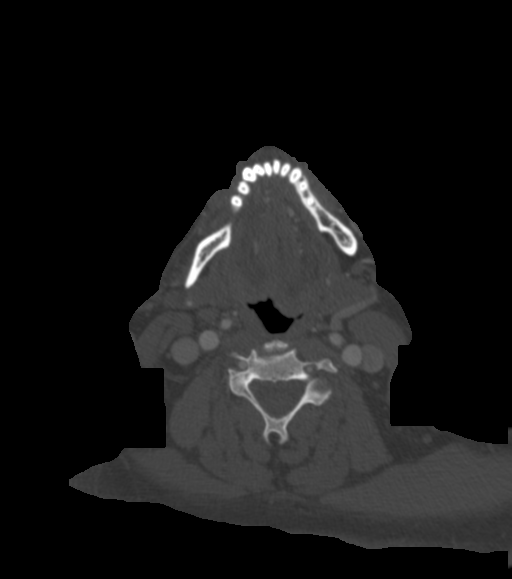
[im 424/707  soft-tissue]
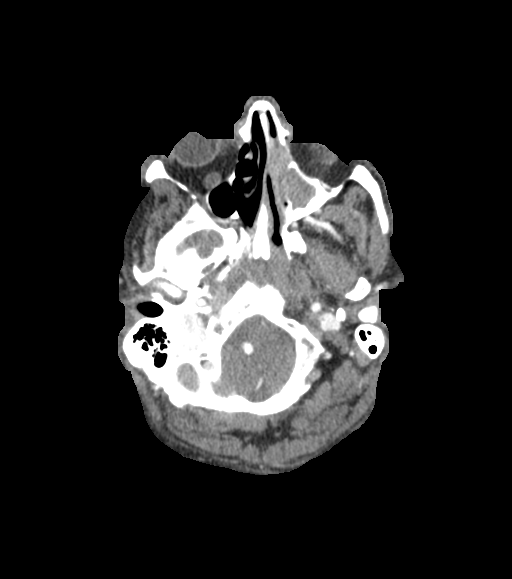
[im 565/707  bone]
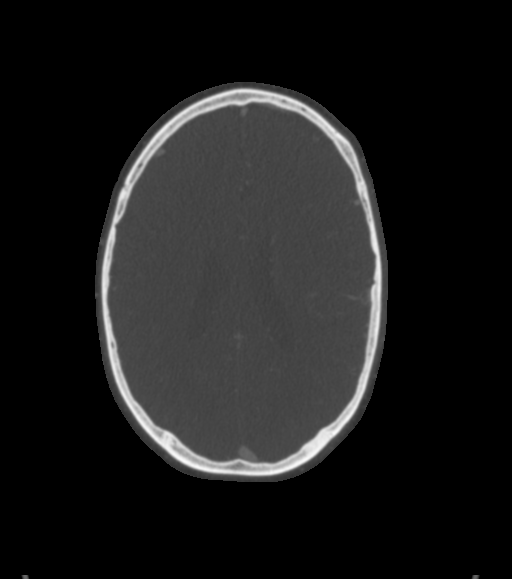

[Series 8: ax thins · axial · 0.49mm/px · z∈[+1393,+1511]mm · 2 of 354 slices shown]
[im 118/354  soft-tissue]
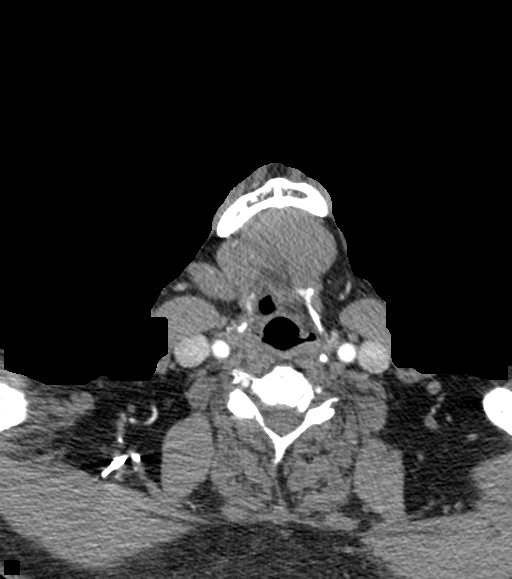
[im 236/354  soft-tissue]
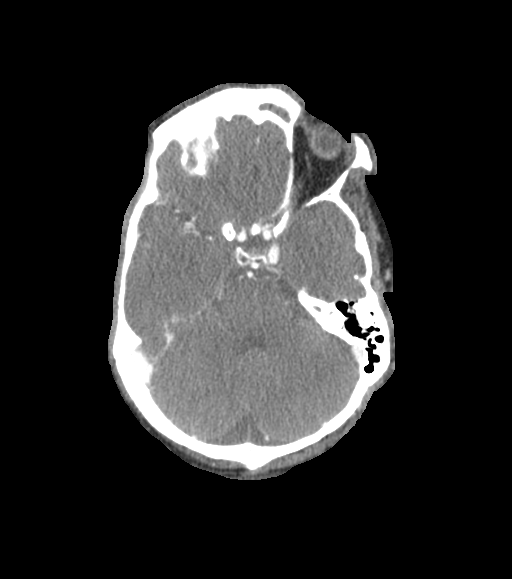

[7 of 34 positions shown; findings below may reference images not displayed]

FINDINGS: CT HEAD FINDINGS

Brain: The brain shows a normal appearance without evidence of
malformation, atrophy, old or acute small or large vessel
infarction, mass lesion, hemorrhage, hydrocephalus or extra-axial
collection.

Vascular: No hyperdense vessel. No evidence of atherosclerotic
calcification.

Skull: Normal.  No traumatic finding.  No focal bone lesion.

Sinuses/Orbits: Chronic inflammatory changes of the left paranasal
sinuses.

Other: None significant

CTA NECK FINDINGS

Aortic arch: Normal

Right carotid system: Normal. No atherosclerotic disease at the
carotid bifurcation.

Left carotid system: Normal. No atherosclerotic disease at the
carotid bifurcation.

Vertebral arteries: Both vertebral arteries widely patent at their
origins and through the cervical region to the foramen magnum. Right
is dominant.

Skeleton: Cervical spondylosis.

Other neck: No mass or lymphadenopathy. Level 2 lymph node on the
left at the upper limits of normal in size. [REDACTED] prominent tissue
at the tongue base on the left. Consider direct inspection.

Upper chest: Emphysema.  No acute process.

Review of the MIP images confirms the above findings

CTA HEAD FINDINGS

Anterior circulation: Both internal carotid arteries widely patent
through the skull base and siphon regions. The anterior and middle
cerebral vessels are patent. No large or medium vessel occlusion. No
correctable proximal stenosis.

Posterior circulation: Both vertebral arteries patent through the
foramen magnum. Right vertebral supplies the basilar. Left vertebral
terminates in PICA. Mild stenosis of the basilar artery. Posterior
circulation branch vessels are patent. Left PCA takes fetal origin
from the anterior circulation.

Venous sinuses: Patent

Anatomic variants: None

Review of the MIP images confirms the above findings
IMPRESSION: No intracranial large or medium vessel occlusion.

No carotid bifurcation disease.

Mild stenosis of the basilar artery, not likely significant.

Emphysema.

Question prominence soft tissue at the tongue base on the left.
Consider direct inspection to rule out tongue base mass. Borderline
prominent level 2 lymph node on the left, but not out of the range
of normal, short axis dimension 1 cm.

## 2020-01-13 IMAGING — CT CT ANGIO HEAD
2 of 11 series · 6 of 34 positions shown · IV contrast (Omnipaque or Isovue)
Comparison: None.

CLINICAL DATA: Swallowing difficulty beginning today.

EXAM:
CT ANGIOGRAPHY HEAD AND NECK
TECHNIQUE: Multidetector CT imaging of the head and neck was performed using
the standard protocol during bolus administration of intravenous
contrast. Multiplanar CT image reconstructions and MIPs were
obtained to evaluate the vascular anatomy. Carotid stenosis
measurements (when applicable) are obtained utilizing NASCET
criteria, using the distal internal carotid diameter as the
denominator.
CONTRAST:  100mL OMNIPAQUE IOHEXOL 350 MG/ML SOLN

[Series 5: sagittal soft · sagittal · 0.32mm/px · 1 of 67 slices shown]
[im 11/67  soft-tissue]
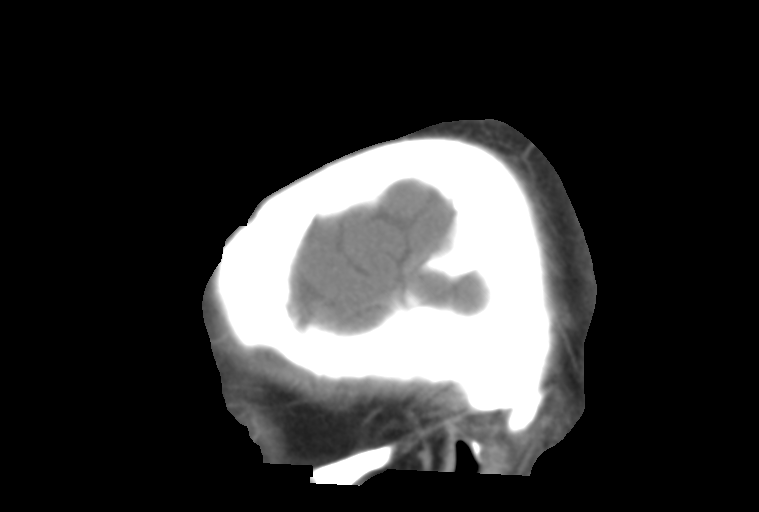

[Series 8: ax thins · axial · 0.49mm/px · z∈[+1334,+1570]mm · 5 of 354 slices shown]
[im 59/354  soft-tissue]
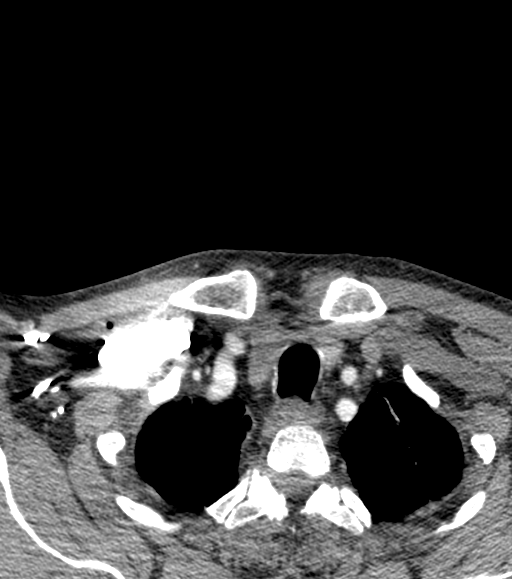
[im 118/354  bone]
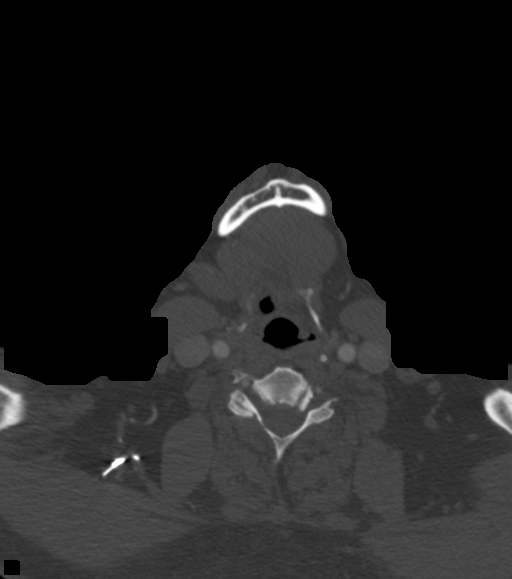
[im 177/354  soft-tissue]
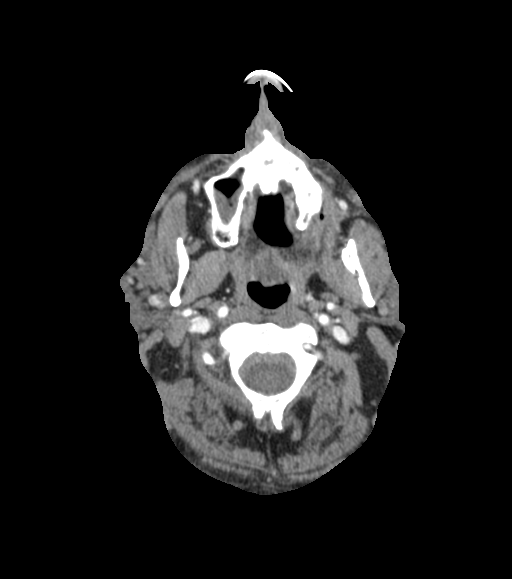
[im 236/354  bone]
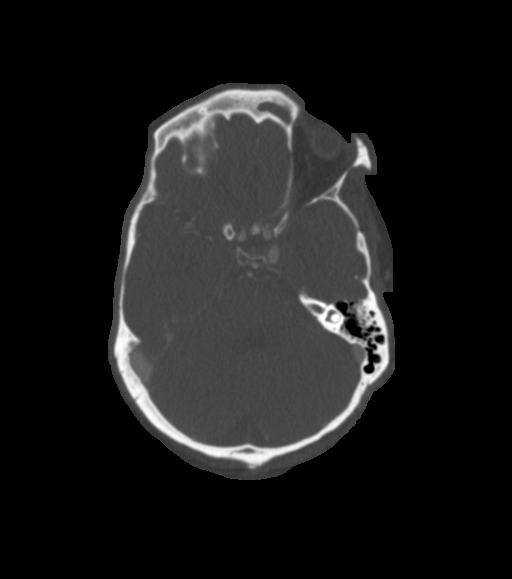
[im 295/354  soft-tissue]
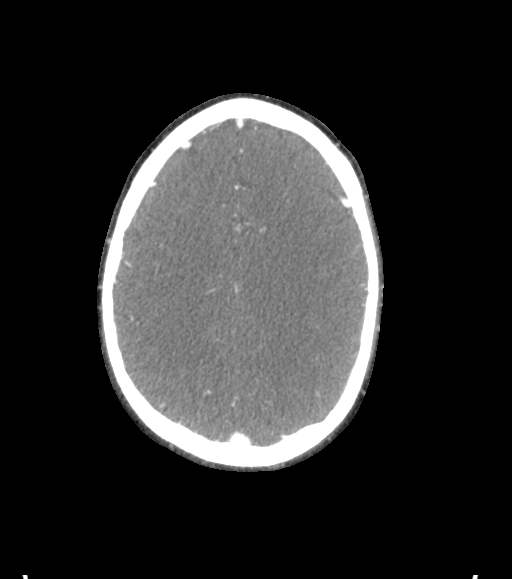

[6 of 34 positions shown; findings below may reference images not displayed]

FINDINGS: CT HEAD FINDINGS

Brain: The brain shows a normal appearance without evidence of
malformation, atrophy, old or acute small or large vessel
infarction, mass lesion, hemorrhage, hydrocephalus or extra-axial
collection.

Vascular: No hyperdense vessel. No evidence of atherosclerotic
calcification.

Skull: Normal.  No traumatic finding.  No focal bone lesion.

Sinuses/Orbits: Chronic inflammatory changes of the left paranasal
sinuses.

Other: None significant

CTA NECK FINDINGS

Aortic arch: Normal

Right carotid system: Normal. No atherosclerotic disease at the
carotid bifurcation.

Left carotid system: Normal. No atherosclerotic disease at the
carotid bifurcation.

Vertebral arteries: Both vertebral arteries widely patent at their
origins and through the cervical region to the foramen magnum. Right
is dominant.

Skeleton: Cervical spondylosis.

Other neck: No mass or lymphadenopathy. Level 2 lymph node on the
left at the upper limits of normal in size. [REDACTED] prominent tissue
at the tongue base on the left. Consider direct inspection.

Upper chest: Emphysema.  No acute process.

Review of the MIP images confirms the above findings

CTA HEAD FINDINGS

Anterior circulation: Both internal carotid arteries widely patent
through the skull base and siphon regions. The anterior and middle
cerebral vessels are patent. No large or medium vessel occlusion. No
correctable proximal stenosis.

Posterior circulation: Both vertebral arteries patent through the
foramen magnum. Right vertebral supplies the basilar. Left vertebral
terminates in PICA. Mild stenosis of the basilar artery. Posterior
circulation branch vessels are patent. Left PCA takes fetal origin
from the anterior circulation.

Venous sinuses: Patent

Anatomic variants: None

Review of the MIP images confirms the above findings
IMPRESSION: No intracranial large or medium vessel occlusion.

No carotid bifurcation disease.

Mild stenosis of the basilar artery, not likely significant.

Emphysema.

Question prominence soft tissue at the tongue base on the left.
Consider direct inspection to rule out tongue base mass. Borderline
prominent level 2 lymph node on the left, but not out of the range
of normal, short axis dimension 1 cm.

## 2020-01-13 MED ORDER — ALBUTEROL SULFATE HFA 108 (90 BASE) MCG/ACT IN AERS
4.0000 | INHALATION_SPRAY | Freq: Once | RESPIRATORY_TRACT | Status: AC
Start: 2020-01-13 — End: 2020-01-13
  Administered 2020-01-13: 04:00:00 4 via RESPIRATORY_TRACT

## 2020-01-13 MED ORDER — IOHEXOL 350 MG/ML SOLN
100.0000 mL | Freq: Once | INTRAVENOUS | Status: AC | PRN
  Administered 2020-01-13: 02:00:00 100 mL via INTRAVENOUS

## 2020-01-13 NOTE — Consult Note (Signed)
[facility] Tele[facility]urology Consult Services  Stat Consult  Date of Service:   01/13/2020 02:53:56  Impression:     .  R17.81 - Slurred speech  Comments/Sign-Out: Patient is a 59 years old man who presents to the ED c/o difficulty swallowing and changes in his speech since waking up yesterday morning. On exam, his voice sounds hoarse but is not dysarthric. He frequently coughs but also has history of emphysema. Non contrast CTH showed no acute abnormalities. CTA head and neck showed no LVO. Etiology of patient's dysphagia and dysphonia is unknown at the time. Recommend further evaluation with brain MRI. Patient would also benefit from evaluation by ENT.  CT HEAD: Showed No Acute Hemorrhage or Acute Core Infarct  Metrics: [facility] Notification Time: 01/13/2020 02:53:56 Stamp Time: 01/13/2020 02:53:56 Callback Response Time: 01/13/2020 02:56:51  Our recommendations are outlined below.  Imaging Studies:     .  MRI Head Without Contrast  Therapies:     .  Speech Therapy  Disposition: Neurology Follow Up Recommended  Sign Out:     .  Discussed with Emergency Department Provider  ----------------------------------------------------------------------------------------------------  Chief Complaint: Difficulty swallowing and hoarseness  History of Present Illness: Patient is a 59 year old Male.  Patient is a 59 years old man who presents to the ED c/o difficulty swallowing and difficulty speaking since waking up yesterday morning at around 10am. LKW earlier that morning before going to bed at around 1:00AM. He also reports numbness of the right side of his face and neck. PMHx significant for emphysema.   Anticoagulant use:  No  Antiplatelet use: No    Examination: BP(122/86), Pulse(91), Blood Glucose(115) 1A: Level of Consciousness - Alert; keenly responsive + 0 1B: Ask Month and Age - Both Questions Right + 0 1C: Blink Eyes & Squeeze Hands - Performs Both Tasks  + 0 2: Test Horizontal Extraocular Movements - Normal + 0 3: Test Visual Fields - No Visual Loss + 0 4: Test Facial Palsy (Use Grimace if Obtunded) - Normal symmetry + 0 5A: Test Left Arm Motor Drift - No Drift for 10 Seconds + 0 5B: Test Right Arm Motor Drift - No Drift for 10 Seconds + 0 6A: Test Left Leg Motor Drift - No Drift for 5 Seconds + 0 6B: Test Right Leg Motor Drift - No Drift for 5 Seconds + 0 7: Test Limb Ataxia (FNF/Heel-Shin) - No Ataxia + 0 8: Test Sensation - Normal; No sensory loss + 0 9: Test Language/Aphasia - Normal; No aphasia + 0 10: Test Dysarthria - Mild-Moderate Dysarthria: Slurring but can be understood + 1 11: Test Extinction/Inattention - No abnormality + 0  NIHSS Score: 1  Patient/Family was informed the Neurology Consult would occur via TeleHealth consult by way of interactive audio and video telecommunications and consented to receiving care in this manner.  Due to the immediate potential for life-threatening deterioration due to underlying acute neurologic illness, I spent 35 minutes providing critical care. This time includes time for face to face visit via telemedicine, review of medical records, imaging studies and discussion of findings with providers, the patient and/or family.   Dr Vira Agar   [facility] [phone]  Case [id number]

## 2020-01-13 NOTE — ED Triage Notes (Signed)
Pt reports difficulty swallowing that started this morning. Reports sore throat, neck (left side), and hoarse. Pt taking new medications including Atarax which pt started taking two days ago.  Pt has hx of COPD and reports he "is always short of breath, but not more than normal." pt also reports left ear being stopped up.

## 2020-01-13 NOTE — ED Notes (Signed)
Pt wanting to leave- says he does not want to be admitted. Dr Clayborne Dana made aware and is at bedside talking with pt

## 2020-01-13 NOTE — ED Provider Notes (Signed)
Emergency Department Provider Note   I have reviewed the triage vital signs and the nursing notes.   HISTORY  Chief Complaint Dysphagia   HPI Justin Price is a 59 y.o. male who presents to the emergency department today secondary to difficulty speaking and feeling of congestion of the left side of his head.  Patient states that he was normal and went to bed last night.  No recent illnesses.  No recent trauma.  No other symptoms.  When he woke up this morning he is having difficulty swallowing even his saliva.  Also having difficulty with speech with a change in his voice.  He also feels like his left ear cheek and neck are swollen.  He feels like maybe that is why he cannot swallow very well.  No difficulty with ambulation or gait.  No arm weakness.   No other associated or modifying symptoms.    Past Medical History:  Diagnosis Date  . COPD (chronic obstructive pulmonary disease) (HCC)     There are no problems to display for this patient.   Past Surgical History:  Procedure Laterality Date  . ORTHOPEDIC SURGERY    . vascetomy      Current Outpatient Rx  . Order #: [id number] Class: Print  . Order #: [id number] Class: Historical Med  . Order #: [id number] Class: Print    Allergies Morphine and related  No family history on file.  Social History Social History   Tobacco Use  . Smoking status: Current Every Day Smoker    Packs/day: 2.00  . Smokeless tobacco: Never Used  Substance Use Topics  . Alcohol use: Yes    Alcohol/week: 5.0 standard drinks    Types: 5 Cans of beer per week  . Drug use: Not on file    Review of Systems  All other systems negative except as documented in the HPI. All pertinent positives and negatives as reviewed in the HPI. ____________________________________________   PHYSICAL EXAM:  VITAL SIGNS: ED Triage Vitals  Enc Vitals Group     BP 01/13/20 0019 (!) 165/105     Pulse Rate 01/13/20 0019 91     Resp 01/13/20 0019 20      Temp 01/13/20 0019 98.6 F (37 C)     Temp Source 01/13/20 0019 Oral     SpO2 01/13/20 0019 99 %     Weight 01/13/20 0021 206 lb (93.4 kg)     Height 01/13/20 0021 5\' 10"  (1.778 m)    Constitutional: Alert and oriented. Well appearing and in no acute distress. Eyes: Conjunctivae are normal. PERRL. EOMI. Head: Atraumatic. Nose: No congestion/rhinnorhea. Mouth/Throat: Mucous membranes are moist.  Oropharynx non-erythematous.  No obvious edema or masses on visual inspection of oropharynx. Neck: No stridor.  No meningeal signs.  No obvious induration or masses in his neck on palpation. Cardiovascular: Normal rate, regular rhythm. Good peripheral circulation. Grossly normal heart sounds.   Respiratory: Normal respiratory effort.  No retractions. Lungs CTAB. Gastrointestinal: Soft and nontender. No distention.  Musculoskeletal: No lower extremity tenderness nor edema. No gross deformities of extremities. Neurologic: As an obvious abnormality to his speech is very high-pitched.  No obvious facial droop.  His uvula and palate are symmetric.  No gross neurologic deficits in his arms or legs.  Extra ocular movements are intact.  Appears to have difficulty initiating swallowing. Skin:  Skin is warm, dry and intact. No rash noted.   ____________________________________________   LABS (all labs ordered are listed, but only abnormal results  are displayed)  Labs Reviewed  COMPREHENSIVE METABOLIC PANEL - Abnormal; Notable for the following components:      Result Value   Glucose, Bld 115 (*)    All other components within normal limits  SARS CORONAVIRUS 2 (TAT 6-24 HRS)  CBC WITH DIFFERENTIAL/PLATELET  PROTIME-INR   ____________________________________________  EKG   EKG Interpretation  Date/Time:    Ventricular Rate:    PR Interval:    QRS Duration:   QT Interval:    QTC Calculation:   R Axis:     Text Interpretation:          ____________________________________________  RADIOLOGY  CT Angio Head W or Wo Contrast  Result Date: 01/13/2020 CLINICAL DATA:  Swallowing difficulty beginning today. EXAM: CT ANGIOGRAPHY HEAD AND NECK TECHNIQUE: Multidetector CT imaging of the head and neck was performed using the standard protocol during bolus administration of intravenous contrast. Multiplanar CT image reconstructions and MIPs were obtained to evaluate the vascular anatomy. Carotid stenosis measurements (when applicable) are obtained utilizing NASCET criteria, using the distal internal carotid diameter as the denominator. CONTRAST:  OMNIPAQUE IOHEXOL 350 MG/ML SOLN COMPARISON:  None. FINDINGS: CT HEAD FINDINGS Brain: The brain shows a normal appearance without evidence of malformation, atrophy, old or acute small or large vessel infarction, mass lesion, hemorrhage, hydrocephalus or extra-axial collection. Vascular: No hyperdense vessel. No evidence of atherosclerotic calcification. Skull: Normal.  No traumatic finding.  No focal bone lesion. Sinuses/Orbits: Chronic inflammatory changes of the left paranasal sinuses. Other: None significant CTA NECK FINDINGS Aortic arch: Normal Right carotid system: Normal. No atherosclerotic disease at the carotid bifurcation. Left carotid system: Normal. No atherosclerotic disease at the carotid bifurcation. Vertebral arteries: Both vertebral arteries widely patent at their origins and through the cervical region to the foramen magnum. Right is dominant. Skeleton: Cervical spondylosis. Other neck: No mass or lymphadenopathy. Level 2 lymph node on the left at the upper limits of normal in size. [place] prominent tissue at the tongue base on the left. Consider direct inspection. Upper chest: Emphysema.  No acute process. Review of the MIP images confirms the above findings CTA HEAD FINDINGS Anterior circulation: Both internal carotid arteries widely patent through the skull base and siphon regions.  The anterior and middle cerebral vessels are patent. No large or medium vessel occlusion. No correctable proximal stenosis. Posterior circulation: Both vertebral arteries patent through the foramen magnum. Right vertebral supplies the basilar. Left vertebral terminates in PICA. Mild stenosis of the basilar artery. Posterior circulation branch vessels are patent. Left PCA takes fetal origin from the anterior circulation. Venous sinuses: Patent Anatomic variants: None Review of the MIP images confirms the above findings IMPRESSION: No intracranial large or medium vessel occlusion. No carotid bifurcation disease. 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 Emphysema. Question prominence soft tissue at the tongue base on the left. Consider direct inspection to rule out tongue base mass. Borderline prominent level 2 lymph node on the left, but not out of the range of normal, short axis dimension 1 cm. Electronically Signed   By: Paulina Fusi M.D.   On: 01/13/2020 02:14   CT Angio Neck W and/or Wo Contrast  Result Date: 01/13/2020 CLINICAL DATA:  Swallowing difficulty beginning today. EXAM: CT ANGIOGRAPHY HEAD AND NECK TECHNIQUE: Multidetector CT imaging of the head and neck was performed using the standard protocol during bolus administration of intravenous contrast. Multiplanar CT image reconstructions and MIPs were obtained to evaluate the vascular anatomy. Carotid stenosis measurements (when applicable) are  obtained utilizing NASCET criteria, using the distal internal carotid diameter as the denominator. CONTRAST:  164mL OMNIPAQUE IOHEXOL 350 MG/ML SOLN COMPARISON:  None. FINDINGS: CT HEAD FINDINGS Brain: The brain shows a normal appearance without evidence of malformation, atrophy, old or acute small or large vessel infarction, mass lesion, hemorrhage, hydrocephalus or extra-axial collection. Vascular: No hyperdense vessel. No evidence of atherosclerotic calcification. Skull: Normal.  No traumatic  finding.  No focal bone lesion. Sinuses/Orbits: Chronic inflammatory changes of the left paranasal sinuses. Other: None significant CTA NECK FINDINGS Aortic arch: Normal Right carotid system: Normal. No atherosclerotic disease at the carotid bifurcation. Left carotid system: Normal. No atherosclerotic disease at the carotid bifurcation. Vertebral arteries: Both vertebral arteries widely patent at their origins and through the cervical region to the foramen magnum. Right is dominant. Skeleton: Cervical spondylosis. Other neck: No mass or lymphadenopathy. Level 2 lymph node on the left at the upper limits of normal in size. [place] prominent tissue at the tongue base on the left. Consider direct inspection. Upper chest: Emphysema.  No acute process. Review of the MIP images confirms the above findings CTA HEAD FINDINGS Anterior circulation: Both internal carotid arteries widely patent through the skull base and siphon regions. The anterior and middle cerebral vessels are patent. No large or medium vessel occlusion. No correctable proximal stenosis. Posterior circulation: Both vertebral arteries patent through the foramen magnum. Right vertebral supplies the basilar. Left vertebral terminates in PICA. Mild stenosis of the basilar artery. Posterior circulation branch vessels are patent. Left PCA takes fetal origin from the anterior circulation. Venous sinuses: Patent Anatomic variants: None Review of the MIP images confirms the above findings IMPRESSION: No intracranial large or medium vessel occlusion. No carotid bifurcation disease. 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 Emphysema. Question prominence soft tissue at the tongue base on the left. Consider direct inspection to rule out tongue base mass. Borderline prominent level 2 lymph node on the left, but not out of the range of normal, short axis dimension 1 cm. Electronically Signed   By: Nelson Chimes M.D.   On: 01/13/2020 02:14   DG Chest  Portable 1 View  Result Date: 01/13/2020 CLINICAL DATA:  Dysphagia for 2 days EXAM: PORTABLE CHEST 1 VIEW COMPARISON:  August 21, 2019 FINDINGS: The heart size and mediastinal contours are within normal limits. Both lungs are clear. The visualized skeletal structures are unremarkable. IMPRESSION: No active disease. Electronically Signed   By: Prudencio Pair M.D.   On: 01/13/2020 02:07    ____________________________________________  INITIAL IMPRESSION / ASSESSMENT AND PLAN / ED COURSE  With a rapid onset of these deficits concern mostly is for stroke.  Also concern for possible malignancy with a history of smoking and other option would be possible vocal cord paralysis however does not explain the congested full feeling he has inside of his neck and face.  Start with CT scans and labs.  Work-up as above without explanation for symptoms.  Neurology consult obtained and recommended MRI of the brain to further evaluate since it is acute in nature.  If that is negative they would recommend direct inspection via laryngoscope by otolaryngology.  Will discuss with hospitalist for hospitalization.  hospitalist will need to send to [facility] to get full workup accomplished. Patient denied. Discussed the limitations of [facility] on the weekend and he elected to leave AMA. He was competent to make the decision, understood risks of undiagnosed stroke, vocal cord problem to include but not ilimited to: death, dehydration,  choking, worsening disease and stated he wanted to leave AMA.  ____________________________________________  FINAL CLINICAL IMPRESSION(S) / ED DIAGNOSES  Final diagnoses:  Hoarse voice quality    MEDICATIONS GIVEN DURING THIS VISIT:  Medications  albuterol (VENTOLIN HFA) 108 (90 Base) MCG/ACT inhaler 4 puff (has no administration in time range)  iohexol (OMNIPAQUE) 350 MG/ML injection 100 mL (100 mLs Intravenous Contrast Given 01/13/20 0139)    NEW OUTPATIENT MEDICATIONS STARTED  DURING THIS VISIT:  New Prescriptions   No medications on file    Note:  This note was prepared with assistance of Dragon voice recognition software. Occasional wrong-word or sound-a-like substitutions may have occurred due to the inherent limitations of voice recognition software.   Cordie Buening, Barbara Cower, MD 01/13/20 [phone]

## 2020-01-13 NOTE — ED Notes (Signed)
Patient transported to CT 

## 2020-01-13 NOTE — ED Notes (Signed)
Pt given water per request- pt able to swallow small sips

## 2020-01-16 ENCOUNTER — Observation Stay (HOSPITAL_COMMUNITY)
Admission: EM | Admit: 2020-01-16 | Discharge: 2020-01-17 | Disposition: A | Discharge status: Home / Self Care | Payer: Medicaid Other | Attending: Family Medicine | Admitting: Family Medicine

## 2020-01-16 ENCOUNTER — Other Ambulatory Visit: Payer: Self-pay

## 2020-01-16 ENCOUNTER — Emergency Department (HOSPITAL_COMMUNITY): Payer: Medicaid Other

## 2020-01-16 ENCOUNTER — Encounter (HOSPITAL_COMMUNITY): Payer: Self-pay | Admitting: Emergency Medicine

## 2020-01-16 DIAGNOSIS — R221 Localized swelling, mass and lump, neck: Secondary | ICD-10-CM | POA: Insufficient documentation

## 2020-01-16 DIAGNOSIS — Z20822 Contact with and (suspected) exposure to covid-19: Secondary | ICD-10-CM | POA: Insufficient documentation

## 2020-01-16 DIAGNOSIS — R1310 Dysphagia, unspecified: Secondary | ICD-10-CM | POA: Diagnosis not present

## 2020-01-16 DIAGNOSIS — J449 Chronic obstructive pulmonary disease, unspecified: Secondary | ICD-10-CM | POA: Diagnosis not present

## 2020-01-16 DIAGNOSIS — F17210 Nicotine dependence, cigarettes, uncomplicated: Secondary | ICD-10-CM | POA: Insufficient documentation

## 2020-01-16 DIAGNOSIS — Z720 Tobacco use: Secondary | ICD-10-CM | POA: Diagnosis present

## 2020-01-16 DIAGNOSIS — F1910 Other psychoactive substance abuse, uncomplicated: Principal | ICD-10-CM | POA: Insufficient documentation

## 2020-01-16 DIAGNOSIS — Z7982 Long term (current) use of aspirin: Secondary | ICD-10-CM | POA: Insufficient documentation

## 2020-01-16 DIAGNOSIS — K219 Gastro-esophageal reflux disease without esophagitis: Secondary | ICD-10-CM | POA: Diagnosis not present

## 2020-01-16 DIAGNOSIS — I5030 Unspecified diastolic (congestive) heart failure: Secondary | ICD-10-CM | POA: Diagnosis not present

## 2020-01-16 DIAGNOSIS — F1410 Cocaine abuse, uncomplicated: Secondary | ICD-10-CM | POA: Diagnosis not present

## 2020-01-16 DIAGNOSIS — I639 Cerebral infarction, unspecified: Secondary | ICD-10-CM | POA: Diagnosis not present

## 2020-01-16 DIAGNOSIS — F419 Anxiety disorder, unspecified: Secondary | ICD-10-CM | POA: Diagnosis present

## 2020-01-16 LAB — RAPID URINE DRUG SCREEN, HOSP PERFORMED
Amphetamines: NOT DETECTED
Barbiturates: NOT DETECTED
Benzodiazepines: POSITIVE — AB
Cocaine: POSITIVE — AB
Opiates: NOT DETECTED
Tetrahydrocannabinol: NOT DETECTED

## 2020-01-16 LAB — CBC WITH DIFFERENTIAL/PLATELET
Abs Immature Granulocytes: 0.11 10*3/uL — ABNORMAL HIGH (ref 0.00–0.07)
Basophils Absolute: 0.1 10*3/uL (ref 0.0–0.1)
Basophils Relative: 1 %
Eosinophils Absolute: 0.4 10*3/uL (ref 0.0–0.5)
Eosinophils Relative: 3 %
HCT: 45.6 % (ref 39.0–52.0)
Hemoglobin: 15.3 g/dL (ref 13.0–17.0)
Immature Granulocytes: 1 %
Lymphocytes Relative: 21 %
Lymphs Abs: 2.2 10*3/uL (ref 0.7–4.0)
MCH: 31.7 pg (ref 26.0–34.0)
MCHC: 33.6 g/dL (ref 30.0–36.0)
MCV: 94.6 fL (ref 80.0–100.0)
Monocytes Absolute: 0.9 10*3/uL (ref 0.1–1.0)
Monocytes Relative: 9 %
Neutro Abs: 6.8 10*3/uL (ref 1.7–7.7)
Neutrophils Relative %: 65 %
Platelets: 313 10*3/uL (ref 150–400)
RBC: 4.82 MIL/uL (ref 4.22–5.81)
RDW: 13.3 % (ref 11.5–15.5)
WBC: 10.5 10*3/uL (ref 4.0–10.5)
nRBC: 0 % (ref 0.0–0.2)

## 2020-01-16 LAB — BASIC METABOLIC PANEL
Anion gap: 9 (ref 5–15)
BUN: 8 mg/dL (ref 6–20)
CO2: 24 mmol/L (ref 22–32)
Calcium: 8.9 mg/dL (ref 8.9–10.3)
Chloride: 104 mmol/L (ref 98–111)
Creatinine, Ser: 0.84 mg/dL (ref 0.61–1.24)
GFR calc Af Amer: >60 mL/min (ref >60)
GFR calc non Af Amer: >60 mL/min (ref >60)
Glucose, Bld: 117 mg/dL — ABNORMAL HIGH (ref 70–99)
Potassium: 4.3 mmol/L (ref 3.5–5.1)
Sodium: 137 mmol/L (ref 135–145)

## 2020-01-16 LAB — URINALYSIS, ROUTINE W REFLEX MICROSCOPIC
Bilirubin Urine: NEGATIVE
Glucose, UA: NEGATIVE mg/dL
Hgb urine dipstick: NEGATIVE
Ketones, ur: NEGATIVE mg/dL
Leukocytes,Ua: NEGATIVE
Nitrite: NEGATIVE
Protein, ur: NEGATIVE mg/dL
Specific Gravity, Urine: 1.003 — ABNORMAL LOW (ref 1.005–1.030)
pH: 7 (ref 5.0–8.0)

## 2020-01-16 LAB — PROTIME-INR
INR: 1 (ref 0.8–1.2)
Prothrombin Time: 12.9 seconds (ref 11.4–15.2)

## 2020-01-16 LAB — APTT: aPTT: 25 seconds (ref 24–36)

## 2020-01-16 LAB — ETHANOL: Alcohol, Ethyl (B): <10 mg/dL (ref <10)

## 2020-01-16 IMAGING — MR MR MRA HEAD W/O CM
1 series · 15 of 48 positions shown · IV contrast (agent unspecified)
Comparison: None.

CT angiogram of the head and neck [DATE]

CLINICAL DATA: Dysphagia, helical pupils, numbness on right side.

EXAM:
MRI HEAD WITHOUT AND WITH CONTRAST
MRA HEAD WITHOUT AND WITH CONTRAST
TECHNIQUE: Multiplanar, multiecho pulse sequences of the brain and surrounding
structures were obtained without and with intravenous contrast.
Angiographic images of the head were obtained using MRA technique
without and with contrast.

[Series 1: TOF fat-sat · axial · 0.8mm · 0.38mm/px · z∈[-66,+33]mm · 15 of 131 slices shown]
[im 1/131]
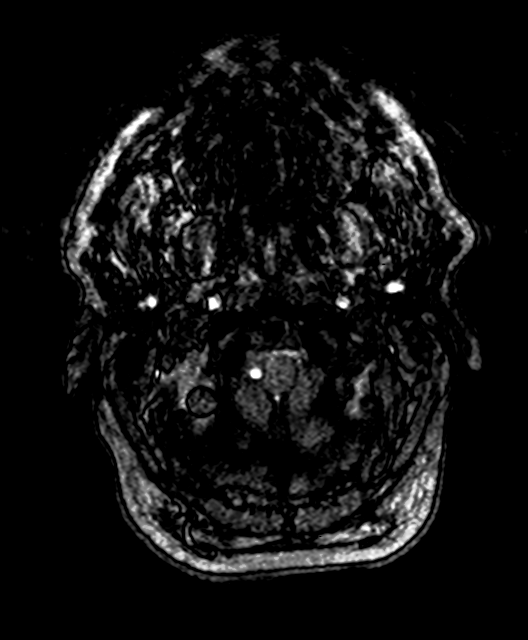
[im 3/131]
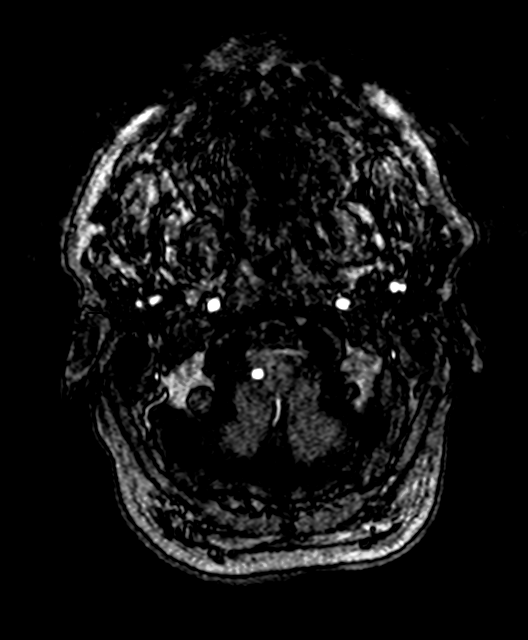
[im 6/131]
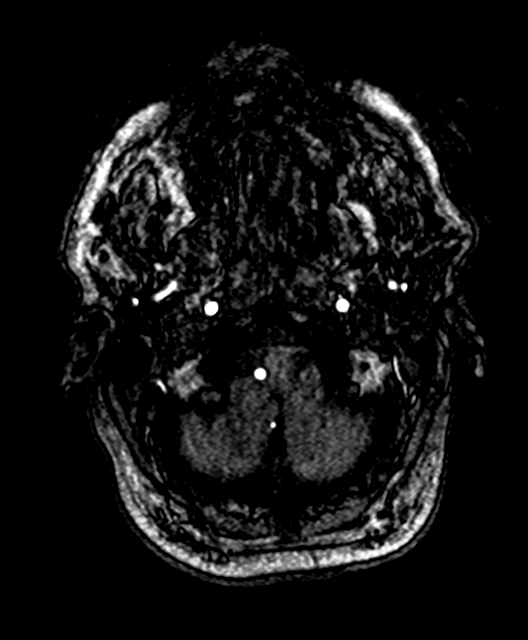
[im 9/131]
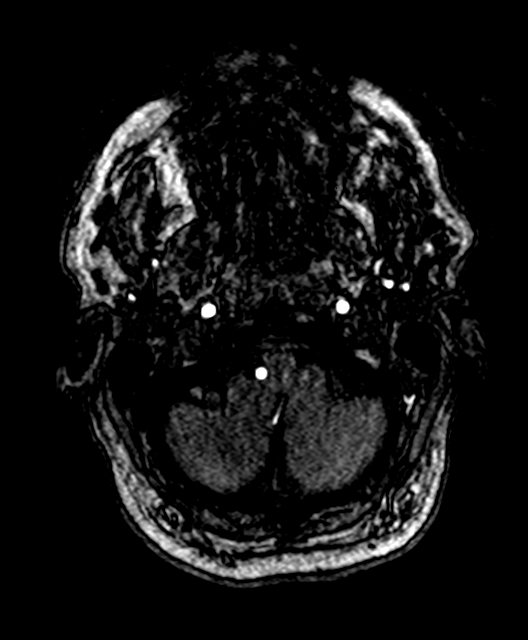
[im 12/131]
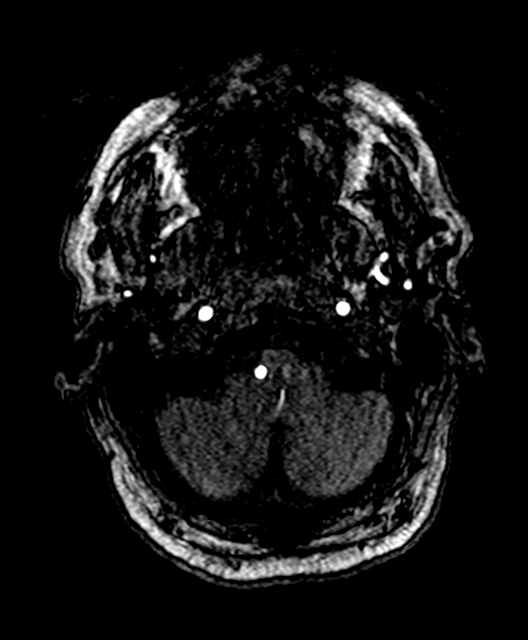
[im 23/131]
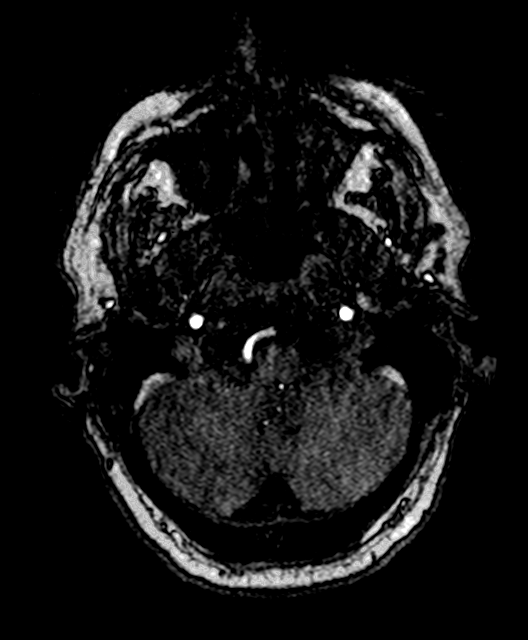
[im 25/131]
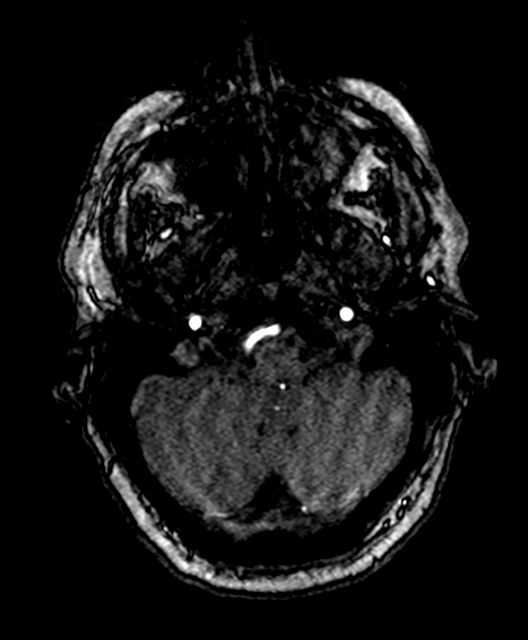
[im 42/131]
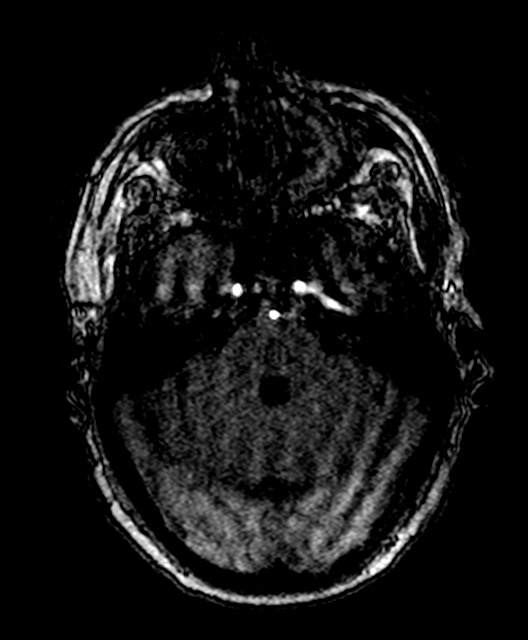
[im 59/131]
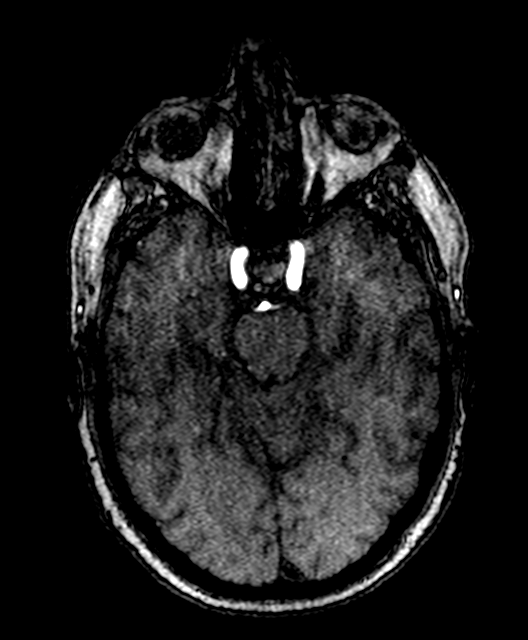
[im 67/131]
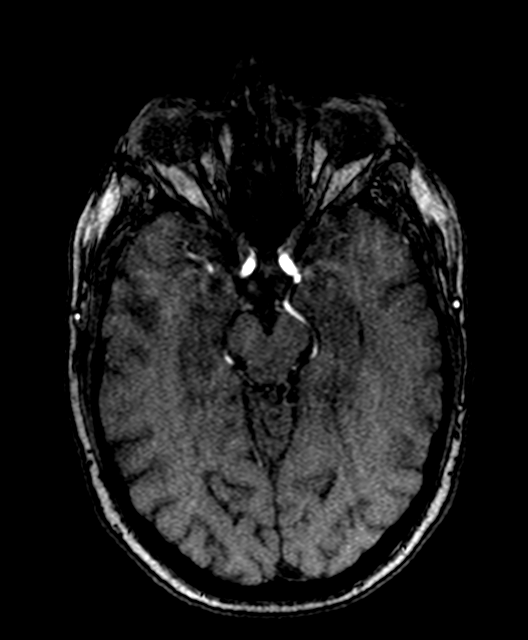
[im 75/131]
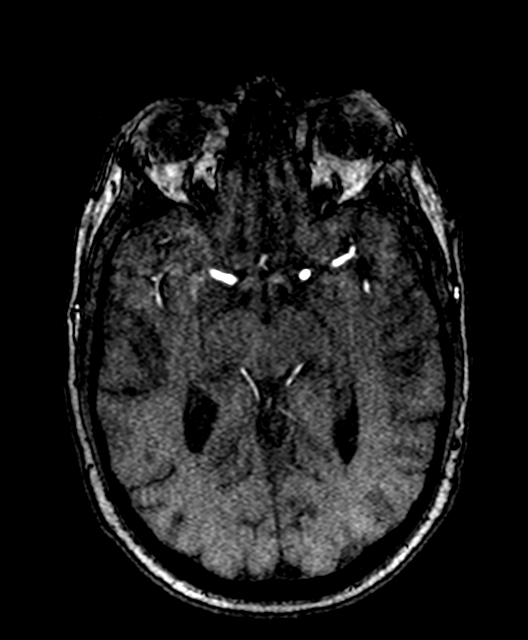
[im 92/131]
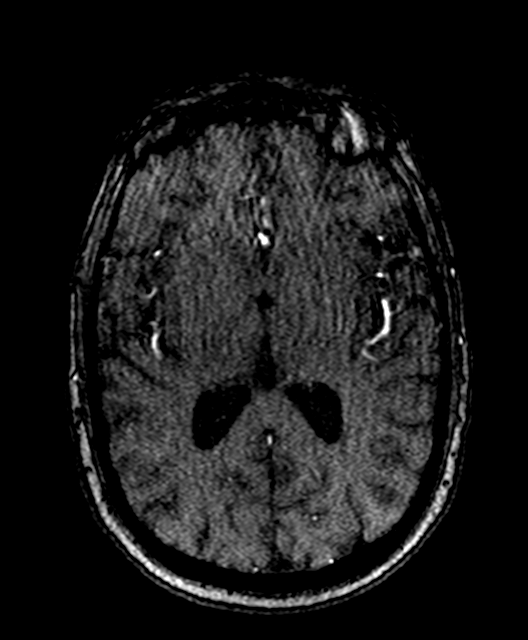
[im 108/131]
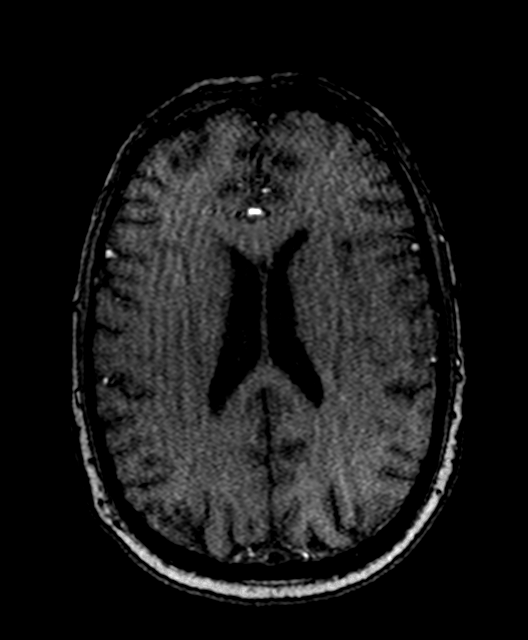
[im 111/131]
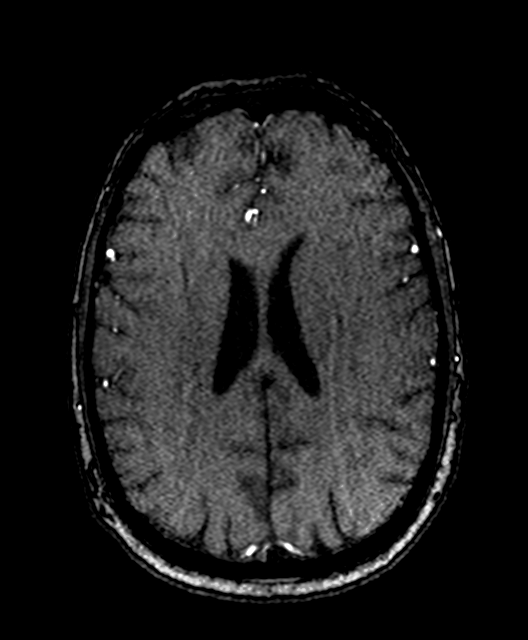
[im 125/131]
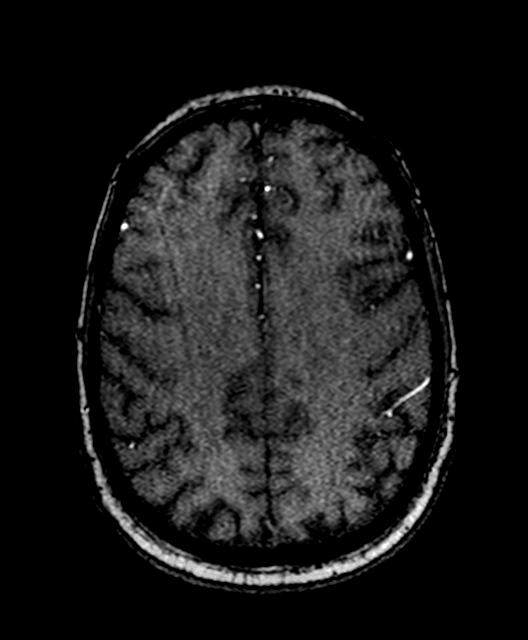

[15 of 48 positions shown; findings below may reference images not displayed]

FINDINGS: MRI HEAD FINDINGS

Brain: Focal is of restricted diffusion is seen within the left
posterolateral aspect of the medulla with associated increased T2
signal, consistent with acute/subacute infarct. Remainder of the
brain parenchyma appear grossly unremarkable. No hydrocephalus,
intracranial hemorrhage or midline shift.

Vascular: Normal flow voids.

Skull and upper cervical spine: Normal marrow signal.

Sinuses/Orbits: Negative.

Other: None.

MRA HEAD FINDINGS

The visualized portions of the distal cervical internal carotid
arteries are widely patent with antegrade flow. The petrous,
cavernous, and supra clinoid segments are symmetric in caliber
bilaterally. A1 segments, anterior communicating artery, and
anterior cerebral arteries are widely patent. Middle cerebral
arteries are widely patent with antegrade flow without high-grade
flow-limiting stenosis or proximal branch occlusion. Distal MCA
branches are symmetric bilaterally. No intracranial aneurysm within
the anterior circulation.

No flow related enhancement is appreciated of the left the vertebral
artery, may be related to slow flow in a hypoplastic artery as seen
on prior CTA; although occlusion cannot be excluded. The right
vertebral artery is widely patent with antegrade flow. The P1
segment of the left vertebral artery is hypoplastic noting a
prominent left posterior communicating artery. The posterior
cerebral arteries are otherwise unremarkable. Basilar artery is
widely patent with normal flow related enhancement. No intracranial
aneurysm within the posterior circulation.

The study is degraded by patient's motion.

Brain: Reported above.
IMPRESSION: 1. Acute/early subacute infarct involving the left posterolateral
aspect of the medulla.
2. Absent flow related enhancement in the left vertebral artery, may
be related to slow flow in a hypoplastic artery as seen on prior
CTA; although occlusion is cannot be excluded.
3. Otherwise unremarkable MRI of the brain.

Results were communicated by phone to Dr. OK[place]IE at [DATE] p.m. on [DATE]

## 2020-01-16 IMAGING — MR MR HEAD W/O CM
8 of 10 series · 33 of 48 positions shown · IV contrast (agent unspecified)
Comparison: None.

CT angiogram of the head and neck [DATE]

CLINICAL DATA: Dysphagia, helical pupils, numbness on right side.

EXAM:
MRI HEAD WITHOUT AND WITH CONTRAST
MRA HEAD WITHOUT AND WITH CONTRAST
TECHNIQUE: Multiplanar, multiecho pulse sequences of the brain and surrounding
structures were obtained without and with intravenous contrast.
Angiographic images of the head were obtained using MRA technique
without and with contrast.

[Series 2: T1 · sagittal · 5.0mm · 0.44mm/px · 3 of 20 slices shown (1 of 2)]
[im 1/20]
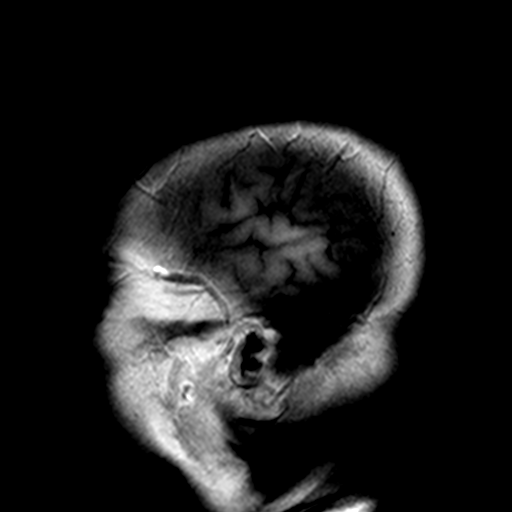
[im 10/20]
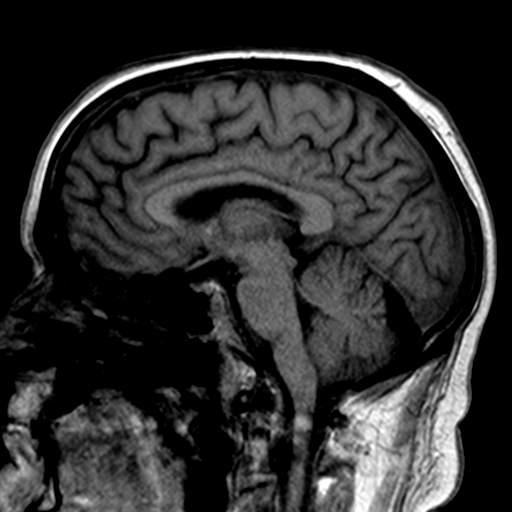
[im 20/20]
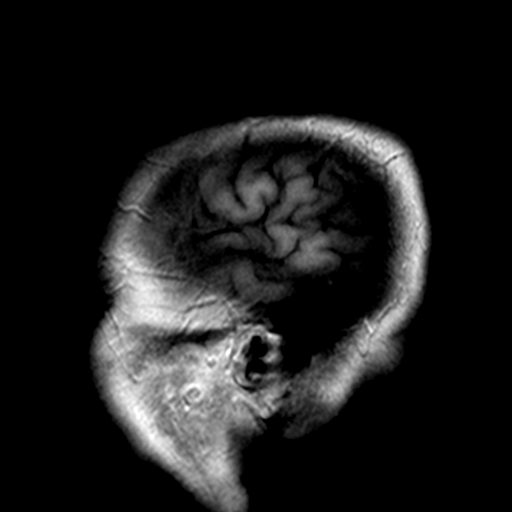

[Series 3: DWI · axial · 3.0mm · 0.73mm/px · z∈[-71,+91]mm · 7 of 55 slices shown (1 of 2)]
[im 1/55]
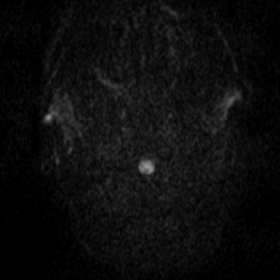
[im 10/55]
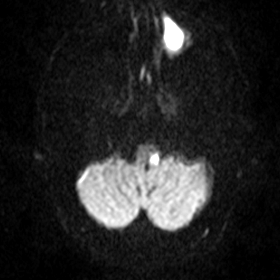
[im 19/55]
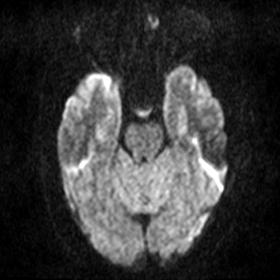
[im 28/55]
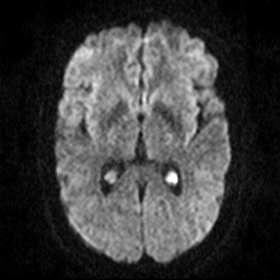
[im 37/55]
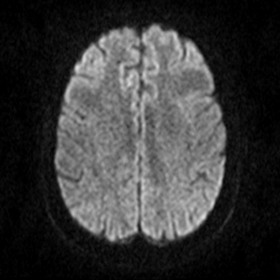
[im 46/55]
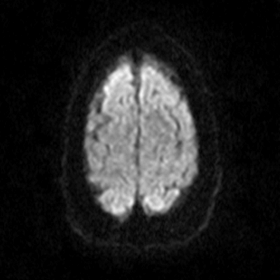
[im 55/55]
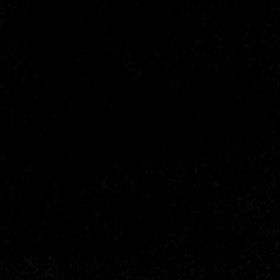

[Series 5: DWI · coronal · 5.0mm · 0.48mm/px · 4 of 36 slices shown (2 of 2)]
[im 1/36]
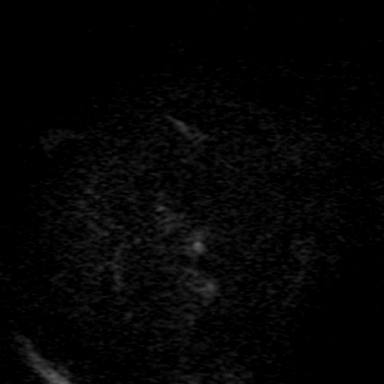
[im 12/36]
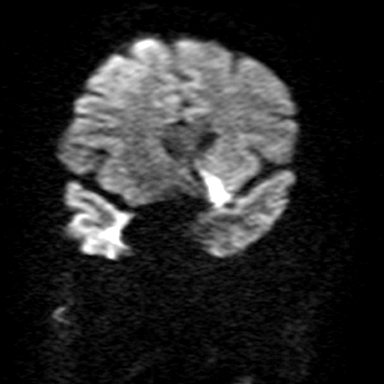
[im 24/36]
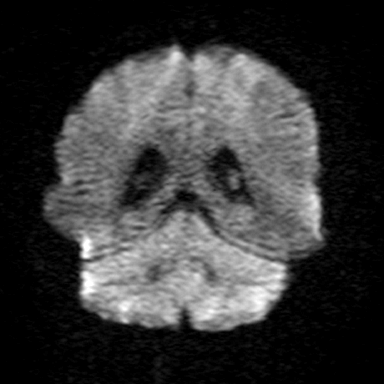
[im 36/36]
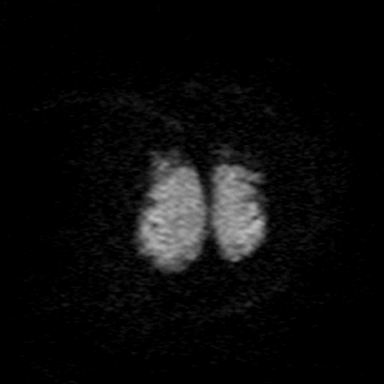

[Series 7: T2 · axial · 5.0mm · 0.72mm/px · z∈[-60,+83]mm · 3 of 23 slices shown (1 of 3)]
[im 1/23]
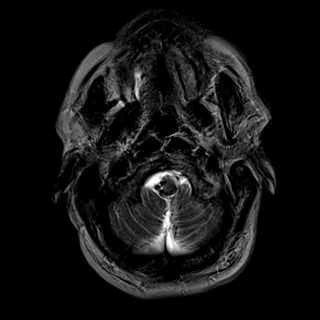
[im 12/23]
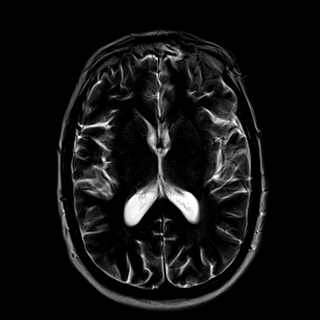
[im 23/23]
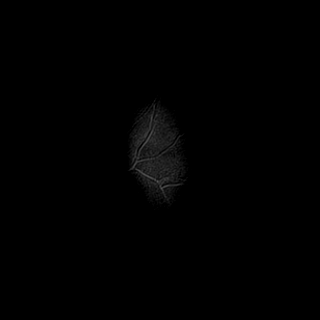

[Series 13: T2 · axial · 5.0mm · 0.45mm/px · z∈[-69,+87]mm · 3 of 25 slices shown (2 of 3)]
[im 1/25]
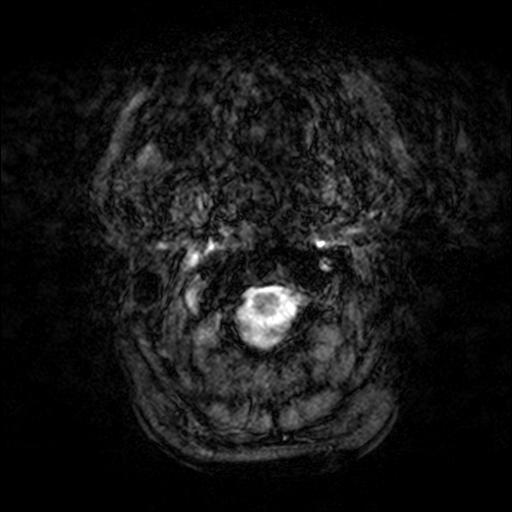
[im 13/25]
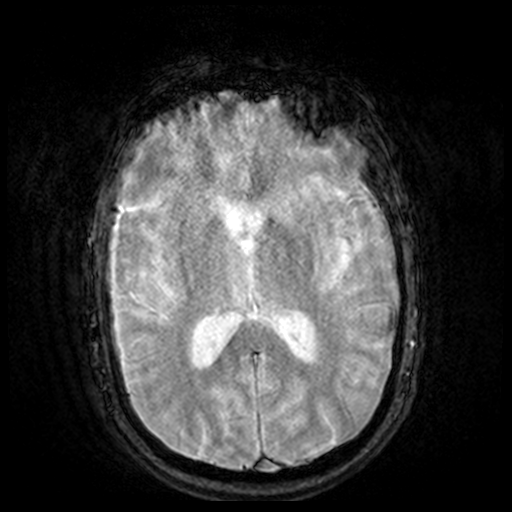
[im 25/25]
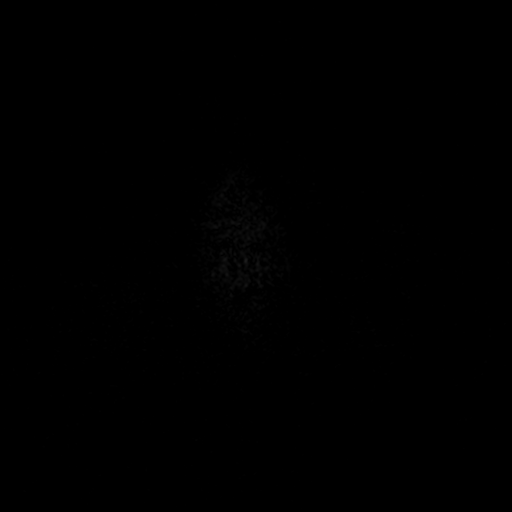

[Series 14: FLAIR · axial · 3.0mm · 0.87mm/px · z∈[-65,+82]mm · 6 of 50 slices shown]
[im 1/50]
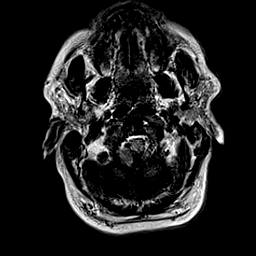
[im 10/50]
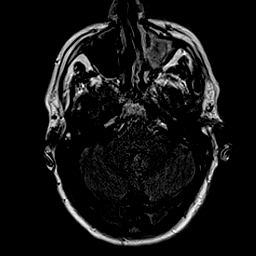
[im 20/50]
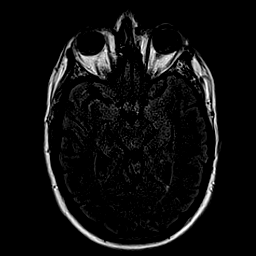
[im 30/50]
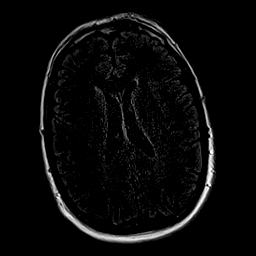
[im 40/50]
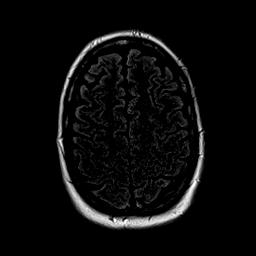
[im 50/50]
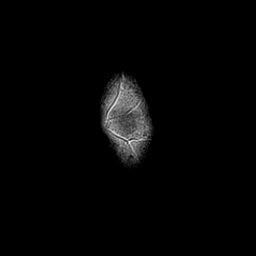

[Series 15: T1 · axial · 2.0mm · 0.43mm/px · z∈[-69,-15]mm · 4 of 74 slices shown (2 of 2)]
[im 1/74]
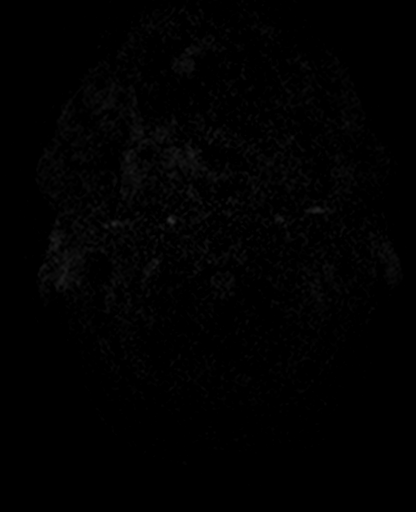
[im 10/74]
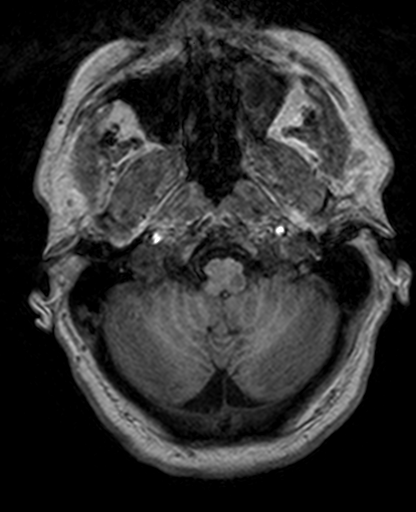
[im 19/74]
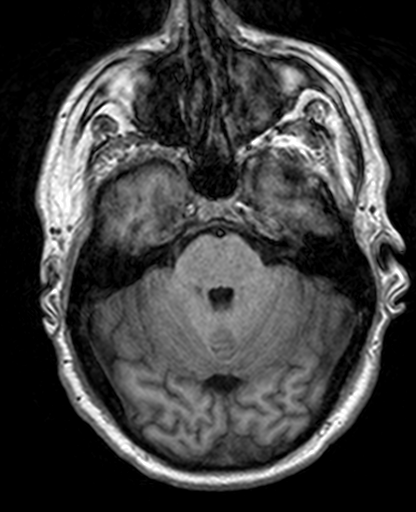
[im 28/74]
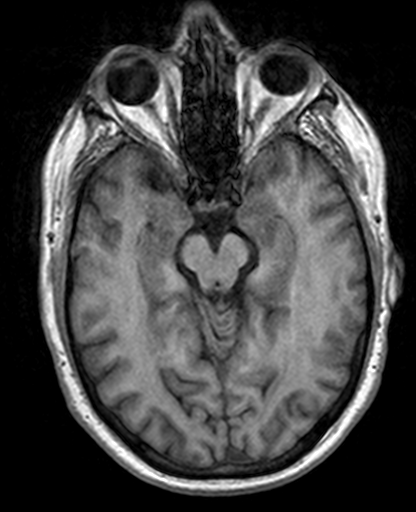

[Series 16: T2 · coronal · 5.0mm · 0.62mm/px · 3 of 28 slices shown (3 of 3)]
[im 1/28]
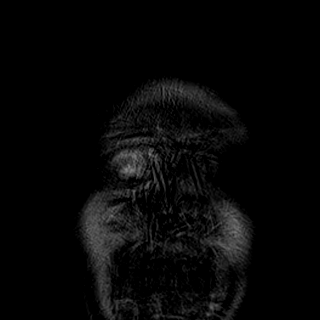
[im 14/28]
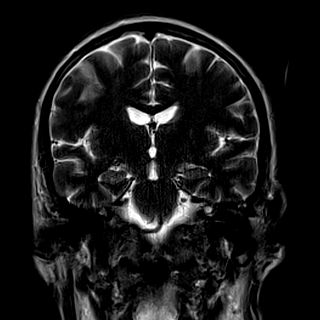
[im 28/28]
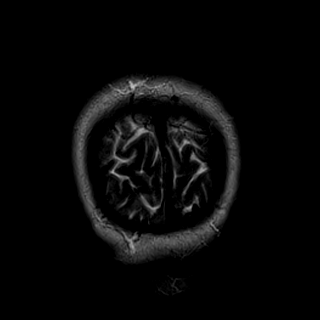

[33 of 48 positions shown; findings below may reference images not displayed]

FINDINGS: MRI HEAD FINDINGS

Brain: Focal is of restricted diffusion is seen within the left
posterolateral aspect of the medulla with associated increased T2
signal, consistent with acute/subacute infarct. Remainder of the
brain parenchyma appear grossly unremarkable. No hydrocephalus,
intracranial hemorrhage or midline shift.

Vascular: Normal flow voids.

Skull and upper cervical spine: Normal marrow signal.

Sinuses/Orbits: Negative.

Other: None.

MRA HEAD FINDINGS

The visualized portions of the distal cervical internal carotid
arteries are widely patent with antegrade flow. The petrous,
cavernous, and supra clinoid segments are symmetric in caliber
bilaterally. A1 segments, anterior communicating artery, and
anterior cerebral arteries are widely patent. Middle cerebral
arteries are widely patent with antegrade flow without high-grade
flow-limiting stenosis or proximal branch occlusion. Distal MCA
branches are symmetric bilaterally. No intracranial aneurysm within
the anterior circulation.

No flow related enhancement is appreciated of the left the vertebral
artery, may be related to slow flow in a hypoplastic artery as seen
on prior CTA; although occlusion cannot be excluded. The right
vertebral artery is widely patent with antegrade flow. The P1
segment of the left vertebral artery is hypoplastic noting a
prominent left posterior communicating artery. The posterior
cerebral arteries are otherwise unremarkable. Basilar artery is
widely patent with normal flow related enhancement. No intracranial
aneurysm within the posterior circulation.

The study is degraded by patient's motion.

Brain: Reported above.
IMPRESSION: 1. Acute/early subacute infarct involving the left posterolateral
aspect of the medulla.
2. Absent flow related enhancement in the left vertebral artery, may
be related to slow flow in a hypoplastic artery as seen on prior
CTA; although occlusion is cannot be excluded.
3. Otherwise unremarkable MRI of the brain.

Results were communicated by phone to Dr. OKATAKYIE at [DATE] p.m. on [DATE]

## 2020-01-16 MED ORDER — ALPRAZOLAM 1 MG PO TABS: 1.0000 mg | ORAL_TABLET | Freq: Two times a day (BID) | ORAL | Status: DC | PRN

## 2020-01-16 MED ORDER — ACETAMINOPHEN 650 MG RE SUPP: 650.0000 mg | RECTAL | Status: DC | PRN

## 2020-01-16 MED ORDER — ACETAMINOPHEN 160 MG/5ML PO SOLN: 650.0000 mg | ORAL | Status: DC | PRN

## 2020-01-16 MED ORDER — SODIUM CHLORIDE 0.9 % IV SOLN: INTRAVENOUS | Status: AC

## 2020-01-16 MED ORDER — SENNOSIDES-DOCUSATE SODIUM 8.6-50 MG PO TABS: 1.0000 | ORAL_TABLET | Freq: Every evening | ORAL | Status: DC | PRN

## 2020-01-16 MED ORDER — ASPIRIN 300 MG RE SUPP: 300.0000 mg | Freq: Every day | RECTAL | Status: DC

## 2020-01-16 MED ORDER — ASPIRIN 325 MG PO TABS
325.0000 mg | ORAL_TABLET | Freq: Every day | ORAL | Status: DC
  Administered 2020-01-16 – 2020-01-17 (×2): 325 mg via ORAL
  Filled 2020-01-16 (×2): qty 1

## 2020-01-16 MED ORDER — BUPRENORPHINE HCL-NALOXONE HCL 2-0.5 MG SL SUBL
2.0000 | SUBLINGUAL_TABLET | Freq: Two times a day (BID) | SUBLINGUAL | Status: DC
  Administered 2020-01-16 – 2020-01-17 (×2): 2 via SUBLINGUAL
  Filled 2020-01-16 (×2): qty 2

## 2020-01-16 MED ORDER — ACETAMINOPHEN 325 MG PO TABS: 650.0000 mg | ORAL_TABLET | ORAL | Status: DC | PRN

## 2020-01-16 MED ORDER — ENOXAPARIN SODIUM 40 MG/0.4ML ~~LOC~~ SOLN
40.0000 mg | SUBCUTANEOUS | Status: DC
  Administered 2020-01-16: 40 mg via SUBCUTANEOUS
  Filled 2020-01-16: qty 0.4

## 2020-01-16 MED ORDER — PANTOPRAZOLE SODIUM 40 MG PO TBEC
40.0000 mg | DELAYED_RELEASE_TABLET | Freq: Every day | ORAL | Status: DC
  Administered 2020-01-16 – 2020-01-17 (×2): 40 mg via ORAL
  Filled 2020-01-16 (×2): qty 1

## 2020-01-16 MED ORDER — NICOTINE 21 MG/24HR TD PT24
21.0000 mg | MEDICATED_PATCH | Freq: Once | TRANSDERMAL | Status: AC
  Administered 2020-01-16: 15:00:00 21 mg via TRANSDERMAL
  Filled 2020-01-16: qty 1

## 2020-01-16 MED ORDER — STROKE: EARLY STAGES OF RECOVERY BOOK
Freq: Once | Status: AC
  Filled 2020-01-16: qty 1

## 2020-01-16 MED ORDER — NICOTINE 21 MG/24HR TD PT24
21.0000 mg | MEDICATED_PATCH | Freq: Every day | TRANSDERMAL | Status: DC
  Administered 2020-01-17: 10:00:00 21 mg via TRANSDERMAL
  Filled 2020-01-16: qty 1

## 2020-01-16 NOTE — ED Notes (Signed)
Dr.Memon at bedside. 

## 2020-01-16 NOTE — ED Provider Notes (Signed)
[facility] EMERGENCY DEPARTMENT Provider Note   CSN: [id number] Arrival date & time: 01/16/20  1104     History Chief Complaint  Patient presents with  . Numbness    STANLY SI is a 59 y.o. male with a history of COPD presenting with a now 3 day history of swallowing difficulty along with loss of hot and cold sensation along his entire right body from his neck, including upper and lower extremity and trunk.  He was seen here 3 days ago at the onset of sx.  His Ct imaging including Ct angio head/neck were negative for acute large vessel occlusion and hemorrhagic cva.  Teleneurology consult recommended MRI imaging to completely R/o cva.  However, there was also swelling at his left tongue base on imaging which was of unclear significance but could not rule this out as being the source of his swallowing difficulty.  Direct laryngosccopy and ENT consult recommended, but pt refused to stay, left ama when he learned he would have be transferred to [facility] for his work up.  He returns today and is willing to be admitted.  He states his have not improved and reports inability to eat any solid foods, even pills broken into small pieces are getting stuck in the back of his throat.  He is able to sip small quantities of water without choking or aspiration.  He denies cp, sob, abd pain, n/v, no focal weakness.   The history is provided by the patient.       Past Medical History:  Diagnosis Date  . COPD (chronic obstructive pulmonary disease) (HCC)     There are no problems to display for this patient.   Past Surgical History:  Procedure Laterality Date  . ORTHOPEDIC SURGERY    . vascetomy         History reviewed. No pertinent family history.  Social History   Tobacco Use  . Smoking status: Current Every Day Smoker    Packs/day: 2.00  . Smokeless tobacco: Never Used  Substance Use Topics  . Alcohol use: Yes    Alcohol/week: 5.0 standard drinks    Types: 5 Cans of beer per week    . Drug use: Not on file    Home Medications Prior to Admission medications   Medication Sig Start Date End Date Taking? Authorizing Provider  albuterol (VENTOLIN HFA) 108 (90 Base) MCG/ACT inhaler Inhale 2 puffs into the lungs every 6 (six) hours as needed. 12/13/19  Yes [provider]  ALPRAZolam Prudy Feeler) 1 MG tablet Take 1 mg by mouth 2 (two) times daily as needed for anxiety.   Yes [provider]  aspirin EC 81 MG tablet Take 81 mg by mouth daily.   Yes [provider]  diclofenac Sodium (VOLTAREN) 1 % GEL Apply 1 application topically in the morning, at noon, in the evening, and at bedtime.   Yes [provider]  Pseudoephedrine-Acetaminophen (PSEUDOEPHEDRINE SINUS PO) Take 1 tablet by mouth 3 (three) times daily.   Yes [provider]  SUBOXONE 4-1 MG FILM Place 1 strip under the tongue 2 (two) times daily. 01/15/20  Yes [provider]  hydrOXYzine (ATARAX/VISTARIL) 25 MG tablet Take 25 mg by mouth 2 (two) times daily as needed. 01/09/20   [provider]  omeprazole (PRILOSEC) 20 MG capsule Take 20 mg by mouth daily. 11/01/19   [provider]    Allergies    Morphine and related  Review of Systems   Review of Systems  Constitutional: Negative for chills and fever.  HENT: Positive for trouble swallowing.   Respiratory: Negative.  Negative for shortness of breath.   Cardiovascular: Negative.  Negative for chest pain and palpitations.  Gastrointestinal: Negative.   Genitourinary: Negative.   Musculoskeletal: Negative.   Skin: Negative.   Neurological: Positive for numbness. Negative for weakness.    Physical Exam Updated Vital Signs BP (!) 123/98   Pulse 97   Temp 97.9 F (36.6 C) (Oral)   Resp 15   Ht 5\' 10"  (1.778 m)   Wt 93 kg   SpO2 100%   BMI 29.42 kg/m   Physical Exam Vitals and nursing note reviewed.  Constitutional:      General: He is not in acute distress.    Appearance: He is  well-developed.  HENT:     Head: Normocephalic and atraumatic.  Eyes:     Extraocular Movements: Extraocular movements intact.     Conjunctiva/sclera: Conjunctivae normal.     Pupils: Pupils are equal, round, and reactive to light.  Cardiovascular:     Rate and Rhythm: Normal rate and regular rhythm.     Heart sounds: Normal heart sounds.  Pulmonary:     Effort: Pulmonary effort is normal.     Breath sounds: Normal breath sounds. No wheezing.  Abdominal:     General: Bowel sounds are normal.     Palpations: Abdomen is soft.     Tenderness: There is no abdominal tenderness.  Musculoskeletal:        General: Normal range of motion.     Cervical back: Normal range of motion.  Skin:    General: Skin is warm and dry.  Neurological:     Mental Status: He is alert and oriented to person, place, and time.     Cranial Nerves: Cranial nerves are intact.     Motor: Motor function is intact. No abnormal muscle tone or pronator drift.     Coordination: Heel to Shin Test normal.     Comments: Unusual quality to voice, but not muffled. Equal grip strength.  Normal sensation to light touch all extremities.  I did not assess hot and cold sensitivity.  Moving all 4 extremities without deficit.  No foot drop.      ED Results / Procedures / Treatments   Labs (all labs ordered are listed, but only abnormal results are displayed) Labs Reviewed  CBC WITH DIFFERENTIAL/PLATELET - Abnormal; Notable for the following components:      Result Value   Abs Immature Granulocytes 0.11 (*)    All other components within normal limits  BASIC METABOLIC PANEL - Abnormal; Notable for the following components:   Glucose, Bld 117 (*)    All other components within normal limits  RAPID URINE DRUG SCREEN, HOSP PERFORMED - Abnormal; Notable for the following components:   Cocaine POSITIVE (*)    Benzodiazepines POSITIVE (*)    All other components within normal limits  URINALYSIS, ROUTINE W REFLEX MICROSCOPIC -  Abnormal; Notable for the following components:   Color, Urine STRAW (*)    Specific Gravity, Urine 1.003 (*)    All other components within normal limits  SARS CORONAVIRUS 2 (TAT 6-24 HRS)  ETHANOL  PROTIME-INR  APTT    EKG EKG Interpretation  Date/Time:  Tuesday January 16 2020 11:22:33 EST Ventricular Rate:  100 PR Interval:    QRS Duration: 85 QT Interval:  332 QTC Calculation: 429 R Axis:   75 Text Interpretation: Sinus tachycardia Borderline low voltage,  extremity leads Confirmed by Veryl Speak [phone]) on 01/16/2020 11:34:31 AM   Radiology MR ANGIO HEAD WO CONTRAST  Result Date: 01/16/2020 CLINICAL DATA:  Dysphagia, helical pupils, numbness on right side. EXAM: MRI HEAD WITHOUT AND WITH CONTRAST MRA HEAD WITHOUT AND WITH CONTRAST TECHNIQUE: Multiplanar, multiecho pulse sequences of the brain and surrounding structures were obtained without and with intravenous contrast. Angiographic images of the head were obtained using MRA technique without and with contrast. COMPARISON:  None. CT angiogram of the head and neck January 13, 2020 FINDINGS: MRI HEAD FINDINGS Brain: Focal is of restricted diffusion is seen within the left posterolateral aspect of the medulla with associated increased T2 signal, consistent with acute/subacute infarct. Remainder of the brain parenchyma appear grossly unremarkable. No hydrocephalus, intracranial hemorrhage or midline shift. Vascular: Normal flow voids. Skull and upper cervical spine: Normal marrow signal. Sinuses/Orbits: Negative. Other: None. MRA HEAD FINDINGS The visualized portions of the distal cervical internal carotid arteries are widely patent with antegrade flow. The petrous, cavernous, and supra clinoid segments are symmetric in caliber bilaterally. A1 segments, anterior communicating artery, and anterior cerebral arteries are widely patent. Middle cerebral arteries are widely patent with antegrade flow without high-grade flow-limiting stenosis or  proximal branch occlusion. Distal MCA branches are symmetric bilaterally. No intracranial aneurysm within the anterior circulation. No flow related enhancement is appreciated of the left the vertebral artery, may be related to slow flow in a hypoplastic artery as seen on prior CTA; although occlusion cannot be excluded. The right vertebral artery is widely patent with antegrade flow. The P1 segment of the left vertebral artery is hypoplastic noting a prominent left posterior communicating artery. The posterior cerebral arteries are otherwise unremarkable. Basilar artery is widely patent with normal flow related enhancement. No intracranial aneurysm within the posterior circulation. The study is degraded by patient's motion. Brain: Reported above. IMPRESSION: 1. 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 2. Absent flow related enhancement in the left vertebral artery, may be related to slow flow in a hypoplastic artery as seen on prior CTA; although occlusion is cannot be excluded. 3. Otherwise unremarkable MRI of the brain. Results were communicated by phone to Dr. Stark Jock at 2:06 p.m. on May 16, 2019 Electronically Signed   By: Pedro Earls M.D.   On: 01/16/2020 14:11   MR BRAIN WO CONTRAST  Result Date: 01/16/2020 CLINICAL DATA:  Dysphagia, helical pupils, numbness on right side. EXAM: MRI HEAD WITHOUT AND WITH CONTRAST MRA HEAD WITHOUT AND WITH CONTRAST TECHNIQUE: Multiplanar, multiecho pulse sequences of the brain and surrounding structures were obtained without and with intravenous contrast. Angiographic images of the head were obtained using MRA technique without and with contrast. COMPARISON:  None. CT angiogram of the head and neck January 13, 2020 FINDINGS: MRI HEAD FINDINGS Brain: Focal is of restricted diffusion is seen within the left posterolateral aspect of the medulla with associated increased T2 signal, consistent with acute/subacute  infarct. Remainder of the brain parenchyma appear grossly unremarkable. No hydrocephalus, intracranial hemorrhage or midline shift. Vascular: Normal flow voids. Skull and upper cervical spine: Normal marrow signal. Sinuses/Orbits: Negative. Other: None. MRA HEAD FINDINGS The visualized portions of the distal cervical internal carotid arteries are widely patent with antegrade flow. The petrous, cavernous, and supra clinoid segments are symmetric in caliber bilaterally. A1 segments, anterior communicating artery, and anterior cerebral arteries are widely patent. Middle cerebral arteries are widely patent with antegrade flow without high-grade flow-limiting stenosis or proximal branch occlusion. Distal  MCA branches are symmetric bilaterally. No intracranial aneurysm within the anterior circulation. No flow related enhancement is appreciated of the left the vertebral artery, may be related to slow flow in a hypoplastic artery as seen on prior CTA; although occlusion cannot be excluded. The right vertebral artery is widely patent with antegrade flow. The P1 segment of the left vertebral artery is hypoplastic noting a prominent left posterior communicating artery. The posterior cerebral arteries are otherwise unremarkable. Basilar artery is widely patent with normal flow related enhancement. No intracranial aneurysm within the posterior circulation. The study is degraded by patient's motion. Brain: Reported above. IMPRESSION: 1. 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 2. Absent flow related enhancement in the left vertebral artery, may be related to slow flow in a hypoplastic artery as seen on prior CTA; although occlusion is cannot be excluded. 3. Otherwise unremarkable MRI of the brain. Results were communicated by phone to Dr. Judd Lien at 2:06 p.m. on May 16, 2019 Electronically Signed   By: Baldemar Lenis M.D.   On: 01/16/2020 14:11    Procedures Procedures  (including critical care time)  Medications Ordered in ED Medications  nicotine (NICODERM CQ - dosed in mg/24 hours) patch 21 mg (21 mg Transdermal Patch Applied 01/16/20 1455)    ED Course  I have reviewed the triage vital signs and the nursing notes.  Pertinent labs & imaging results that were available during my care of the patient were reviewed by me and considered in my medical decision making (see chart for details).    MDM Rules/Calculators/A&P                      Pt with a 3 day history of difficulty swallowing along with right sided changes in sensation to hot and cold.  MRI today confirms cva of the medulla. ekg and labs reviewed.    Discussed with Dr. Kerry Hough who accepts pt for admission. Final Clinical Impression(s) / ED Diagnoses Final diagnoses:  Cerebrovascular accident (CVA), unspecified mechanism (HCC)  Substance abuse [facility])    Rx / DC Orders ED Discharge Orders    None       Victoriano Lain 01/16/20 1702    Geoffery Lyons, MD 01/19/20 1505

## 2020-01-16 NOTE — ED Notes (Signed)
Pt back from MRI 

## 2020-01-16 NOTE — H&P (Addendum)
History and Physical    Justin Price UQJ:[id number] DOB: [birth year] DOA: 01/16/2020  PCP: Kara Pacer, NP  Patient coming from: Home  I have personally briefly reviewed patient's old medical records in [facility]  Chief Complaint: Difficulty swallowing, right-sided numbness  HPI: Justin Price is a 59 y.o. male with medical history significant of GERD, tobacco use, anxiety, initially began having symptoms of dysphagia, difficulty swallowing approximately 4 days ago.  He says these are new symptoms, but also says that he has had difficulty swallowing intermittently for quite some time.  He came to the ER for evaluation that evening where CT angio of the head and neck did not show any signs of infarct or large vessel obstruction.  Further work-up including MRI was recommended with transfer down to [facility], patient refused and left the hospital AGAINST MEDICAL ADVICE.  Upon returning home, following evening he began having symptoms of numbness in his right upper and lower extremity including his right trunk.  He denies extension into his face.  He may have had some blurry vision but is unclear.  He did not have any weakness on that side.  He returned to the hospital today for evaluation.  Vitals are noted to be stable.  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  He has been referred for admission.   Review of Systems: As per HPI otherwise 10 point review of systems negative.    Past Medical History:  Diagnosis Date  . COPD (chronic obstructive pulmonary disease) (HCC)     Past Surgical History:  Procedure Laterality Date  . ORTHOPEDIC SURGERY    . vascetomy      Social History:  reports that he has been smoking. He has been smoking about 2.00 packs per day. He has never used smokeless tobacco. He reports current alcohol use of about 5.0 standard drinks of alcohol per week. No history on file for  drug.  Allergies  Allergen Reactions  . Morphine And Related Nausea And Vomiting    Family history: Family history reviewed and not pertinent  Prior to Admission medications   Medication Sig Start Date End Date Taking? Authorizing Provider  albuterol (VENTOLIN HFA) 108 (90 Base) MCG/ACT inhaler Inhale 2 puffs into the lungs every 6 (six) hours as needed. 12/13/19  Yes [provider]  ALPRAZolam Prudy Feeler) 1 MG tablet Take 1 mg by mouth 2 (two) times daily as needed for anxiety.   Yes [provider]  aspirin EC 81 MG tablet Take 81 mg by mouth daily.   Yes [provider]  diclofenac Sodium (VOLTAREN) 1 % GEL Apply 1 application topically in the morning, at noon, in the evening, and at bedtime.   Yes [provider]  Pseudoephedrine-Acetaminophen (PSEUDOEPHEDRINE SINUS PO) Take 1 tablet by mouth 3 (three) times daily.   Yes [provider]  SUBOXONE 4-1 MG FILM Place 1 strip under the tongue 2 (two) times daily. 01/15/20  Yes [provider]  hydrOXYzine (ATARAX/VISTARIL) 25 MG tablet Take 25 mg by mouth 2 (two) times daily as needed. 01/09/20   [provider]  omeprazole (PRILOSEC) 20 MG capsule Take 20 mg by mouth daily. 11/01/19   [provider]    Physical Exam: Vitals:   01/16/20 1430 01/16/20 1500 01/16/20 1530 01/16/20 1600  BP: 101/82 (!) 123/98 (!) 139/126 (!) 133/95  Pulse: 88 97 87 84  Resp: 11 15 (!) 21 (!) 22  Temp:      TempSrc:      SpO2: 98% 100% 97% 98%  Weight:      Height:        Constitutional: NAD, calm, comfortable Eyes: right pupil is dilated when compared to left, lids and conjunctivae normal ENMT: Mucous membranes are moist. Posterior pharynx clear of any exudate or lesions.Normal dentition.  Neck: normal, supple, no masses, no thyromegaly Respiratory: clear to auscultation bilaterally, no wheezing, no crackles. Normal respiratory effort. No accessory muscle use.  Cardiovascular: Regular  rate and rhythm, no murmurs / rubs / gallops. No extremity edema. 2+ pedal pulses. No carotid bruits.  Abdomen: no tenderness, no masses palpated. No hepatosplenomegaly. Bowel sounds positive.  Musculoskeletal: no clubbing / cyanosis. No joint deformity upper and lower extremities. Good ROM, no contractures. Normal muscle tone.  Skin: no rashes, lesions, ulcers. No induration Neurologic: CN 2-12 grossly intact. Sensation intact, DTR normal. Strength 5/5 in all 4.  Psychiatric: Normal judgment and insight. Alert and oriented x 3. Normal mood.    Labs on Admission: I have personally reviewed following labs and imaging studies  CBC: Recent Labs  Lab 01/13/20 0045 01/16/20 1146  WBC 9.5 10.5  NEUTROABS 6.8 6.8  HGB 13.6 15.3  HCT 40.5 45.6  MCV 94.0 94.6  PLT 283 161   Basic Metabolic Panel: Recent Labs  Lab 01/13/20 0045 01/16/20 1146  NA 136 137  K 4.1 4.3  CL 103 104  CO2 25 24  GLUCOSE 115* 117*  BUN 8 8  CREATININE 0.83 0.84  CALCIUM 8.9 8.9   GFR: Estimated Creatinine Clearance: 111.2 mL/min (by C-G formula based on SCr of 0.84 mg/dL). Liver Function Tests: Recent Labs  Lab 01/13/20 0045  AST 23  ALT 29  ALKPHOS 72  BILITOT 0.6  PROT 7.2  ALBUMIN 3.9   No results for input(s): LIPASE, AMYLASE in the last 168 hours. No results for input(s): AMMONIA in the last 168 hours. Coagulation Profile: Recent Labs  Lab 01/13/20 0045 01/16/20 1513  INR 1.0 1.0   Cardiac Enzymes: No results for input(s): CKTOTAL, CKMB, CKMBINDEX, TROPONINI in the last 168 hours. BNP (last 3 results) No results for input(s): PROBNP in the last 8760 hours. HbA1C: No results for input(s): HGBA1C in the last 72 hours. CBG: No results for input(s): GLUCAP in the last 168 hours. Lipid Profile: No results for input(s): CHOL, HDL, LDLCALC, TRIG, CHOLHDL, LDLDIRECT in the last 72 hours. Thyroid Function Tests: No results for input(s): TSH, T4TOTAL, FREET4, T3FREE, THYROIDAB in the last  72 hours. Anemia Panel: No results for input(s): VITAMINB12, FOLATE, FERRITIN, TIBC, IRON, RETICCTPCT in the last 72 hours. Urine analysis:    Component Value Date/Time   COLORURINE STRAW (A) 01/16/2020 1430   APPEARANCEUR CLEAR 01/16/2020 1430   LABSPEC 1.003 (L) 01/16/2020 1430   PHURINE 7.0 01/16/2020 1430   GLUCOSEU NEGATIVE 01/16/2020 1430   HGBUR NEGATIVE 01/16/2020 1430   BILIRUBINUR NEGATIVE 01/16/2020 1430   KETONESUR NEGATIVE 01/16/2020 1430   PROTEINUR NEGATIVE 01/16/2020 1430   NITRITE NEGATIVE 01/16/2020 1430   LEUKOCYTESUR NEGATIVE 01/16/2020 1430    Radiological Exams on Admission: MR ANGIO HEAD WO CONTRAST  Result Date: 01/16/2020 CLINICAL DATA:  Dysphagia, helical pupils, numbness on right side. EXAM: MRI HEAD WITHOUT AND WITH CONTRAST MRA HEAD WITHOUT AND WITH CONTRAST TECHNIQUE: Multiplanar, multiecho pulse sequences of the brain and surrounding structures were obtained without and with intravenous contrast. Angiographic images of the head were obtained using MRA technique without  and with contrast. COMPARISON:  None. CT angiogram of the head and neck January 13, 2020 FINDINGS: MRI HEAD FINDINGS Brain: Focal is of restricted diffusion is seen within the left posterolateral aspect of the medulla with associated increased T2 signal, consistent with acute/subacute infarct. Remainder of the brain parenchyma appear grossly unremarkable. No hydrocephalus, intracranial hemorrhage or midline shift. Vascular: Normal flow voids. Skull and upper cervical spine: Normal marrow signal. Sinuses/Orbits: Negative. Other: None. MRA HEAD FINDINGS The visualized portions of the distal cervical internal carotid arteries are widely patent with antegrade flow. The petrous, cavernous, and supra clinoid segments are symmetric in caliber bilaterally. A1 segments, anterior communicating artery, and anterior cerebral arteries are widely patent. Middle cerebral arteries are widely patent with antegrade  flow without high-grade flow-limiting stenosis or proximal branch occlusion. Distal MCA branches are symmetric bilaterally. No intracranial aneurysm within the anterior circulation. No flow related enhancement is appreciated of the left the vertebral artery, may be related to slow flow in a hypoplastic artery as seen on prior CTA; although occlusion cannot be excluded. The right vertebral artery is widely patent with antegrade flow. The P1 segment of the left vertebral artery is hypoplastic noting a prominent left posterior communicating artery. The posterior cerebral arteries are otherwise unremarkable. Basilar artery is widely patent with normal flow related enhancement. No intracranial aneurysm within the posterior circulation. The study is degraded by patient's motion. Brain: Reported above. IMPRESSION: 1. 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 2. Absent flow related enhancement in the left vertebral artery, may be related to slow flow in a hypoplastic artery as seen on prior CTA; although occlusion is cannot be excluded. 3. Otherwise unremarkable MRI of the brain. Results were communicated by phone to Dr. Judd Lien at 2:06 p.m. on May 16, 2019 Electronically Signed   By: Baldemar Lenis M.D.   On: 01/16/2020 14:11   MR BRAIN WO CONTRAST  Result Date: 01/16/2020 CLINICAL DATA:  Dysphagia, helical pupils, numbness on right side. EXAM: MRI HEAD WITHOUT AND WITH CONTRAST MRA HEAD WITHOUT AND WITH CONTRAST TECHNIQUE: Multiplanar, multiecho pulse sequences of the brain and surrounding structures were obtained without and with intravenous contrast. Angiographic images of the head were obtained using MRA technique without and with contrast. COMPARISON:  None. CT angiogram of the head and neck January 13, 2020 FINDINGS: MRI HEAD FINDINGS Brain: Focal is of restricted diffusion is seen within the left posterolateral aspect of the medulla with associated increased  T2 signal, consistent with acute/subacute infarct. Remainder of the brain parenchyma appear grossly unremarkable. No hydrocephalus, intracranial hemorrhage or midline shift. Vascular: Normal flow voids. Skull and upper cervical spine: Normal marrow signal. Sinuses/Orbits: Negative. Other: None. MRA HEAD FINDINGS The visualized portions of the distal cervical internal carotid arteries are widely patent with antegrade flow. The petrous, cavernous, and supra clinoid segments are symmetric in caliber bilaterally. A1 segments, anterior communicating artery, and anterior cerebral arteries are widely patent. Middle cerebral arteries are widely patent with antegrade flow without high-grade flow-limiting stenosis or proximal branch occlusion. Distal MCA branches are symmetric bilaterally. No intracranial aneurysm within the anterior circulation. No flow related enhancement is appreciated of the left the vertebral artery, may be related to slow flow in a hypoplastic artery as seen on prior CTA; although occlusion cannot be excluded. The right vertebral artery is widely patent with antegrade flow. The P1 segment of the left vertebral artery is hypoplastic noting a prominent left posterior communicating artery. The posterior cerebral arteries  are otherwise unremarkable. Basilar artery is widely patent with normal flow related enhancement. No intracranial aneurysm within the posterior circulation. The study is degraded by patient's motion. Brain: Reported above. IMPRESSION: 1. 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 2. Absent flow related enhancement in the left vertebral artery, may be related to slow flow in a hypoplastic artery as seen on prior CTA; although occlusion is cannot be excluded. 3. Otherwise unremarkable MRI of the brain. Results were communicated by phone to Dr. Judd Lien at 2:06 p.m. on May 16, 2019 Electronically Signed   By: Baldemar Lenis M.D.   On:  01/16/2020 14:11    EKG: Independently reviewed.  Sinus tachycardia  Assessment/Plan Active Problems:   CVA (cerebral vascular accident) (HCC)   Tobacco abuse   Cocaine abuse (HCC)   GERD (gastroesophageal reflux disease)   Anxiety   Dysphagia     1. Acute CVA.  Noted on MRI brain. Patient does not take any antiplatelet agents PTA. Will start the patient on aspirn.  He is already had imaging of his head and neck.  Will check echocardiogram.  Check lipid panel and A1c.  Will request neurology input. 2. Dysphagia.  Suspect this is related to CVA.  ? Wallenberg syndrome. Overall, he feels that his symptoms have mildly improved since initial onset.  We will get speech therapy evaluation.   3. Abnormal CT neck.  CT of the neck shows fullness at the base of the tongue with concern for underlying mass.  Case reviewed with Dr. Jearld Fenton on call for ENT who recommended outpatient follow-up to be considered for direct visualization. 4. Anxiety.  Continue on Ativan 5. Cocaine abuse.  Counsel on the importance of cessation 6. Tobacco use.  Counseled on importance of cessation 7. GERD.  Continue on PPI  DVT prophylaxis: Lovenox Code Status: Full code Family Communication: Discussed with patient Disposition Plan: Discharge home once work-up is complete Consults called: Neurology Admission status: Observation, telemetry  Erick Blinks MD [facility]   If 7PM-7AM, please contact night-coverage [email or web address]   01/16/2020, 5:48 PM

## 2020-01-16 NOTE — ED Triage Notes (Addendum)
Pt reports seen for same on Saturday and was supposed to be transferred but refused at that time. Pt reports unsteady gait has resolved. Pt reports dysphagia, unequal pupuils, numbness on right side extremity since Saturday. Pt alert and oriented. Equal grips, facial symmetry, bilateral sensation intact. Pt able to ambulate with steady gait to room. nad noted.

## 2020-01-17 ENCOUNTER — Observation Stay (HOSPITAL_BASED_OUTPATIENT_CLINIC_OR_DEPARTMENT_OTHER): Payer: Medicaid Other

## 2020-01-17 DIAGNOSIS — I639 Cerebral infarction, unspecified: Secondary | ICD-10-CM | POA: Diagnosis not present

## 2020-01-17 DIAGNOSIS — F1410 Cocaine abuse, uncomplicated: Secondary | ICD-10-CM | POA: Diagnosis not present

## 2020-01-17 DIAGNOSIS — F419 Anxiety disorder, unspecified: Secondary | ICD-10-CM | POA: Diagnosis not present

## 2020-01-17 LAB — ECHOCARDIOGRAM COMPLETE
Height: 70 in
Weight: 3216.95 oz

## 2020-01-17 LAB — HIV ANTIBODY (ROUTINE TESTING W REFLEX): HIV Screen 4th Generation wRfx: NONREACTIVE

## 2020-01-17 LAB — LIPID PANEL
Cholesterol: 168 mg/dL (ref 0–200)
HDL: 33 mg/dL — ABNORMAL LOW (ref >40)
LDL Cholesterol: 68 mg/dL (ref 0–99)
Total CHOL/HDL Ratio: 5.1 RATIO
Triglycerides: 335 mg/dL — ABNORMAL HIGH (ref <150)
VLDL: 67 mg/dL — ABNORMAL HIGH (ref 0–40)

## 2020-01-17 LAB — SARS CORONAVIRUS 2 (TAT 6-24 HRS): SARS Coronavirus 2: NEGATIVE

## 2020-01-17 MED ORDER — SODIUM CHLORIDE 0.9 % IV SOLN: INTRAVENOUS | Status: DC

## 2020-01-17 MED ORDER — ATORVASTATIN CALCIUM 40 MG PO TABS: 40.0000 mg | ORAL_TABLET | Freq: Every evening | ORAL | 11 refills | Status: AC

## 2020-01-17 MED ORDER — CLOPIDOGREL BISULFATE 75 MG PO TABS: 75.0000 mg | ORAL_TABLET | Freq: Every day | ORAL | 11 refills | Status: AC

## 2020-01-17 MED ORDER — ASPIRIN EC 81 MG PO TBEC: 81.0000 mg | DELAYED_RELEASE_TABLET | Freq: Every day | ORAL | 0 refills | Status: AC

## 2020-01-17 MED ORDER — ACETAMINOPHEN 325 MG PO TABS: 650.0000 mg | ORAL_TABLET | ORAL | 0 refills | Status: AC | PRN

## 2020-01-17 MED ORDER — NICOTINE 14 MG/24HR TD PT24: 14.0000 mg | MEDICATED_PATCH | Freq: Every day | TRANSDERMAL | 0 refills | Status: AC

## 2020-01-17 NOTE — TOC Transition Note (Signed)
Transition of Care [facility]) - CM/SW Discharge Note   Patient Details  Name: Justin Price MRN: [id number] Date of Birth: 12-05-1961  Transition of Care [facility]) CM/SW Contact:  Elliot Gault, LCSW Phone Number: 01/17/2020, 12:44 PM   Clinical Narrative:     Pt being discharged home today. PT/OT have no follow up recommendations. Received TOC consult to speak with pt regarding need for AODA treatment resources. Spoke with pt and discussed substance use history. Pt states that he does not use cocaine on a regular basis. He states he was at a [employer]. Pt denies need for any treatment resources at this time. He states he understands that the physician recommends no further use.  There are no other TOC needs identified for dc.  Expected Discharge Plan: Home/Self Care Barriers to Discharge: Barriers Resolved   Patient Goals and CMS Choice        Expected Discharge Plan and Services Expected Discharge Plan: Home/Self Care       Living arrangements for the past 2 months: Single Family Home Expected Discharge Date: 01/17/20                                    Prior Living Arrangements/Services Living arrangements for the past 2 months: Single Family Home Lives with:: Self Patient language and need for interpreter reviewed:: Yes Do you feel safe going back to the place where you live?: Yes      Need for Family Participation in Patient Care: No (Comment) Care giver support system in place?: Yes (comment)   Criminal Activity/Legal Involvement Pertinent to Current Situation/Hospitalization: No - Comment as needed  Activities of Daily Living Home Assistive Devices/Equipment: None ADL Screening (condition at time of admission) Patient's cognitive ability adequate to safely complete daily activities?: Yes Is the patient deaf or have difficulty hearing?: No Does the patient have difficulty seeing, even when wearing glasses/contacts?: No Does the patient have  difficulty concentrating, remembering, or making decisions?: No Patient able to express need for assistance with ADLs?: Yes Does the patient have difficulty dressing or bathing?: No Independently performs ADLs?: Yes (appropriate for developmental age) Does the patient have difficulty walking or climbing stairs?: No Weakness of Legs: None Weakness of Arms/Hands: None  Permission Sought/Granted                  Emotional Assessment   Attitude/Demeanor/Rapport: Engaged Affect (typically observed): Pleasant Orientation: : Oriented to Self, Oriented to Place, Oriented to  Time, Oriented to Situation Alcohol / Substance Use: Illicit Drugs, Alcohol Use Psych Involvement: No (comment)  Admission diagnosis:  Substance abuse (HCC) [F19.10] CVA (cerebral vascular accident) (HCC) [I63.9] Cerebrovascular accident (CVA), unspecified mechanism (HCC) [I63.9] Acute CVA (cerebrovascular accident) [facility]) [I63.9] Patient Active Problem List   Diagnosis Date Noted  . Acute CVA (cerebrovascular accident) (HCC) 01/17/2020  . CVA (cerebral vascular accident) (HCC) 01/16/2020  . Tobacco abuse 01/16/2020  . Cocaine abuse (HCC) 01/16/2020  . GERD (gastroesophageal reflux disease) 01/16/2020  . Anxiety 01/16/2020  . Dysphagia 01/16/2020   PCP:  Kara Pacer, NP Pharmacy:   [facility] - [place], [place] - [address] [place] [address] Phone: [phone] Fax: [phone]     Social Determinants of Health (SDOH) Interventions    Readmission Risk Interventions No flowsheet data found.   Final next level of care: Home/Self Care Barriers to Discharge: Barriers Resolved  Patient Goals and CMS Choice        Discharge Placement                       Discharge Plan and Services                                     Social Determinants of Health (SDOH) Interventions     Readmission Risk Interventions No flowsheet data  found.

## 2020-01-17 NOTE — Evaluation (Signed)
Physical Therapy Evaluation Patient Details Name: Justin Price MRN: [id number] DOB: 09-09-1961 Today's Date: 01/17/2020   History of Present Illness  Justin Price is a 59 y.o. male with medical history significant of GERD, tobacco use, anxiety, initially began having symptoms of dysphagia, difficulty swallowing approximately 4 days ago.  He says these are new symptoms, but also says that he has had difficulty swallowing intermittently for quite some time.  He came to the ER for evaluation that evening where CT angio of the head and neck did not show any signs of infarct or large vessel obstruction.  Further work-up including MRI was recommended with transfer down to [facility], patient refused and left the hospital [place].  Upon returning home, following evening he began having symptoms of numbness in his right upper and lower extremity including his right trunk.  He denies extension into his face.  He may have had some blurry vision but is unclear.  He did not have any weakness on that side.  He returned to the hospital today for evaluation.  Vitals are noted to be stable.  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  He has been referred for admission.    Clinical Impression  Patient functioning at baseline for functional mobility and gait.  Plan:  Patient discharged from physical therapy to care of nursing for ambulation daily as tolerated for length of stay.     Follow Up Recommendations No PT follow up    Equipment Recommendations  None recommended by PT    Recommendations for Other Services       Precautions / Restrictions Precautions Precautions: None Restrictions Weight Bearing Restrictions: No      Mobility  Bed Mobility Overal bed mobility: Modified Independent             General bed mobility comments: HOB raised  Transfers Overall transfer level: Modified independent Equipment used: None                 Ambulation/Gait Ambulation/Gait assistance: Modified independent (Device/Increase time) Gait Distance (Feet): 200 Feet Assistive device: None Gait Pattern/deviations: WFL(Within Functional Limits) Gait velocity: slightly decreased   General Gait Details: demonstrates good return for ambulation on level, inclined and declined surfaces without loss of balance  Stairs            Wheelchair Mobility    Modified Rankin (Stroke Patients Only)       Balance Overall balance assessment: No apparent balance deficits (not formally assessed)                                           Pertinent Vitals/Pain Pain Assessment: 0-10 Pain Score: 9  Pain Location: right side of body Pain Descriptors / Indicators: Numbness Pain Intervention(s): Limited activity within patient's tolerance;Monitored during session    Home Living Family/patient expects to be discharged to:: Private residence   Available Help at Discharge: Family;Available PRN/intermittently Type of Home: Apartment Home Access: Level entry     Home Layout: One level Home Equipment: None      Prior Function Level of Independence: Independent         Comments: Independent with ADLs, drives     Hand Dominance   Dominant Hand: Right    Extremity/Trunk Assessment   Upper Extremity Assessment Upper Extremity Assessment: Defer to OT evaluation  Lower Extremity Assessment Lower Extremity Assessment: Overall WFL for tasks assessed    Cervical / Trunk Assessment Cervical / Trunk Assessment: Normal  Communication   Communication: No difficulties  Cognition Arousal/Alertness: Awake/alert Behavior During Therapy: WFL for tasks assessed/performed Overall Cognitive Status: Within Functional Limits for tasks assessed                                        General Comments      Exercises     Assessment/Plan    PT Assessment Patent does not need any  further PT services  PT Problem List         PT Treatment Interventions      PT Goals (Current goals can be found in the Care Plan section)  Acute Rehab PT Goals Patient Stated Goal: return home PT Goal Formulation: With patient Time For Goal Achievement: 01/17/20 Potential to Achieve Goals: Good    Frequency     Barriers to discharge        Co-evaluation               AM-PAC PT "6 Clicks" Mobility  Outcome Measure Help needed turning from your back to your side while in a flat bed without using bedrails?: None Help needed moving from lying on your back to sitting on the side of a flat bed without using bedrails?: None Help needed moving to and from a bed to a chair (including a wheelchair)?: None Help needed standing up from a chair using your arms (e.g., wheelchair or bedside chair)?: None Help needed to walk in hospital room?: None Help needed climbing 3-5 steps with a railing? : None 6 Click Score: 24    End of Session   Activity Tolerance: Patient tolerated treatment well Patient left: Other (comment)(standing in room) Nurse Communication: Mobility status PT Visit Diagnosis: Unsteadiness on feet (R26.81);Other abnormalities of gait and mobility (R26.89);Muscle weakness (generalized) (M62.81)    Time: 6440-3474 PT Time Calculation (min) (ACUTE ONLY): 14 min   Charges:   PT Evaluation $PT Eval Low Complexity: 1 Low PT Treatments $Therapeutic Activity: 8-22 mins        11:55 AM, 01/17/20 Ocie Bob, MPT Physical Therapist with [facility] 336 [phone] office [phone] mobile phone

## 2020-01-17 NOTE — Discharge Instructions (Signed)
1)Please take Aspirin 81 mg daily along with Plavix 75 mg daily for 30 days then after that STOP the aspirin and continue ONLY Plavix 75 mg daily indefinitely--for stroke prevention  2)Avoid ibuprofen/Advil/Aleve/Motrin/Goody Powders/Naproxen/BC powders/Meloxicam/Diclofenac/Indomethacin and other Nonsteroidal anti-inflammatory medications as these will make you more likely to bleed and can cause stomach ulcers, can also cause Kidney problems.   3) smoking cessation advised/complete abstinence from tobacco strongly advised  4) complete abstinence from cocaine and other street drugs strongly advised  5)Please take your medications as prescribed to reduce your risk for another stroke and heart attacks  6)Please follow-up with Neurologist Dr. Beryle Beams-- Phone: [phone], Address: [address], [place], [place] [address] in 4 to 6 weeks for recheck and reevaluation.  Please call to make appointment with him

## 2020-01-17 NOTE — Plan of Care (Signed)
  Problem: Education: Goal: Knowledge of General Education information will improve Description: Including pain rating scale, medication(s)/side effects and non-pharmacologic comfort measures 01/17/2020 1414 by Karolee Ohs, RN Outcome: Adequate for Discharge 01/17/2020 1413 by Karolee Ohs, RN Outcome: Progressing   Problem: Health Behavior/Discharge Planning: Goal: Ability to manage health-related needs will improve 01/17/2020 1414 by Karolee Ohs, RN Outcome: Adequate for Discharge 01/17/2020 1413 by Karolee Ohs, RN Outcome: Progressing   Problem: Clinical Measurements: Goal: Ability to maintain clinical measurements within normal limits will improve Outcome: Adequate for Discharge Goal: Will remain free from infection Outcome: Adequate for Discharge Goal: Diagnostic test results will improve Outcome: Adequate for Discharge Goal: Respiratory complications will improve Outcome: Adequate for Discharge Goal: Cardiovascular complication will be avoided Outcome: Adequate for Discharge   Problem: Activity: Goal: Risk for activity intolerance will decrease Outcome: Adequate for Discharge   Problem: Nutrition: Goal: Adequate nutrition will be maintained Outcome: Adequate for Discharge   Problem: Coping: Goal: Level of anxiety will decrease Outcome: Adequate for Discharge   Problem: Elimination: Goal: Will not experience complications related to bowel motility Outcome: Adequate for Discharge Goal: Will not experience complications related to urinary retention Outcome: Adequate for Discharge   Problem: Pain Managment: Goal: General experience of comfort will improve Outcome: Adequate for Discharge   Problem: Safety: Goal: Ability to remain free from injury will improve Outcome: Adequate for Discharge   Problem: Skin Integrity: Goal: Risk for impaired skin integrity will decrease Outcome: Adequate for Discharge   Problem: Education: Goal: Knowledge of disease or  condition will improve 01/17/2020 1414 by Karolee Ohs, RN Outcome: Adequate for Discharge 01/17/2020 1413 by Karolee Ohs, RN Outcome: Progressing Goal: Knowledge of secondary prevention will improve Outcome: Adequate for Discharge   Problem: Self-Care: Goal: Verbalization of feelings and concerns over difficulty with self-care will improve Outcome: Adequate for Discharge

## 2020-01-17 NOTE — Progress Notes (Signed)
Pt D/C home per MD orders. Instructions given to and understood by pt. Pt ambulated downstairs w/ no c/o and nad. Pt's girlfriend is driving him home.

## 2020-01-17 NOTE — Evaluation (Signed)
Occupational Therapy Evaluation Patient Details Name: Justin Price MRN: [id number] DOB: 1961-07-02 Today's Date: 01/17/2020    History of Present Illness ULIS KAPS is a 59 y.o. male with medical history significant of GERD, tobacco use, anxiety, initially began having symptoms of dysphagia, difficulty swallowing approximately 4 days ago.  He says these are new symptoms, but also says that he has had difficulty swallowing intermittently for quite some time.  He came to the ER for evaluation that evening where CT angio of the head and neck did not show any signs of infarct or large vessel obstruction.  Further work-up including MRI was recommended with transfer down to [facility], patient refused and left the hospital [place].  Upon returning home, following evening he began having symptoms of numbness in his right upper and lower extremity including his right trunk.  He denies extension into his face.  He may have had some blurry vision but is unclear.  He did not have any weakness on that side.  He returned to the hospital today for evaluation.  Vitals are noted to be stable.  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  He has been referred for admission.   Clinical Impression   Pt agreeable to OT evaluation. Pt is independent in ADL completion, reports he is unable to distinguish hot/cold and is unable feel a needle stick in his RUE. Pt light touch sensation is intact however has loss of protective sensation-pain and temperature. Pt right pupil is dilated and pt reporting occasional blurry vision. Pt with BUE strength WNL, coordination is intact. Pt is performing ADLs independently, educated on need to use vision and left hand to compensate for sensation differences in RUE. Pt verbalized understanding. No further acute OT services required at this time.     Follow Up Recommendations  No OT follow up    Equipment  Recommendations  None recommended by OT       Precautions / Restrictions Precautions Precautions: None Restrictions Weight Bearing Restrictions: No      Mobility Bed Mobility Overal bed mobility: Modified Independent                Transfers Overall transfer level: Modified independent Equipment used: None                      ADL either performed or assessed with clinical judgement   ADL Overall ADL's : Modified independent;At baseline                                       General ADL Comments: Pt performing ADLs independently, without difficulty     Vision Baseline Vision/History: No visual deficits Patient Visual Report: Blurring of vision;Other (comment)(comes and goes) Vision Assessment?: Yes Eye Alignment: Within Functional Limits Ocular Range of Motion: Within Functional Limits Alignment/Gaze Preference: Within Defined Limits Tracking/Visual Pursuits: Able to track stimulus in all quads without difficulty Saccades: Within functional limits Convergence: Within functional limits Visual Fields: No apparent deficits 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             Pertinent Vitals/Pain Pain Assessment: No/denies pain     Hand Dominance Right   Extremity/Trunk Assessment Upper Extremity Assessment Upper Extremity Assessment: Overall WFL for tasks assessed(BUE 4+5)   Lower Extremity Assessment Lower Extremity Assessment: Defer to PT evaluation  Cervical / Trunk Assessment Cervical / Trunk Assessment: Normal   Communication Communication Communication: No difficulties   Cognition Arousal/Alertness: Awake/alert Behavior During Therapy: WFL for tasks assessed/performed Overall Cognitive Status: Within Functional Limits for tasks assessed                                                Home Living Family/patient expects to be discharged to:: Private residence Living  Arrangements: Other relatives Available Help at Discharge: Family;Available PRN/intermittently Type of Home: Apartment Home Access: Level entry     Home Layout: One level     Bathroom Shower/Tub: Chief Strategy Officer: Standard     Home Equipment: None          Prior Functioning/Environment Level of Independence: Independent        Comments: Independent with ADLs, drives        OT Problem List: Impaired sensation       AM-PAC OT "6 Clicks" Daily Activity     Outcome Measure Help from another person eating meals?: None Help from another person taking care of personal grooming?: None Help from another person toileting, which includes using toliet, bedpan, or urinal?: None Help from another person bathing (including washing, rinsing, drying)?: None Help from another person to put on and taking off regular upper body clothing?: None Help from another person to put on and taking off regular lower body clothing?: None 6 Click Score: 24   End of Session    Activity Tolerance: Patient tolerated treatment well Patient left: in bed;with call bell/phone within reach  OT Visit Diagnosis: Other symptoms and signs involving the nervous system (P32.951)                Time: 8841-6606 OT Time Calculation (min): 11 min Charges:  OT General Charges $OT Visit: 1 Visit OT Evaluation $OT Eval Low Complexity: 1 Low   Ezra Sites, OTR/L  [phone] 01/17/2020, 7:51 AM

## 2020-01-17 NOTE — Consult Note (Signed)
[facility] Justin Estelle A. Gerilyn Pilgrim, MD     [email or web address]          Justin Price is an 59 y.o. male.   ASSESSMENT/PLAN: 1.  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  This is most likely a small vessel event given the presentation and imaging.  Risk factors are age and cocaine use.  It is fine for the patient to be on dual antiplatelet agents for a couple weeks or so.  Afterwards aspirin is recommended.  The patient is encouraged to discontinue using cocaine.  Low-dose statin medication is also recommended. 2.  Opioid use disorder continue with Suboxone 3.  Cocaine use/abuse: Again he is encouraged to discontinue this.     The patient is 59 year old white male who presents with the acute onset of dysphagia about 4 days ago.  The patient decided not to seek medical attention because he thought it was due to his sinus problems.  However a couple days later he developed numbness involving the right side.  He reports of gait instability also although this was mild.  He denies any focal weakness.  He does not report having any spinning sensation or lightheadedness.  He denies dysarthria.  Patient was confronted about cocaine be present in his toxicology report.  He actually tells me that he does not do this routinely and in fact he admits to doing the illegal drug use while he was watching the Super Bowl on Sunday.  There are no reports of chest pain, palpitation or shortness of breath.  The review of systems otherwise negative.    GENERAL: This is a pleasant male who is in no acute distress.  HEENT: The neck is supple no trauma appreciated.  ABDOMEN: soft  EXTREMITIES: No edema   BACK: Normal  SKIN: Normal by inspection.    MENTAL STATUS: Alert and oriented -including his name, hospital age and month. Speech, language and cognition are generally intact. Judgment and insight normal.    CRANIAL NERVES: The left pupil is 2 mm and reactive.  The right is 4 mm and also reactive.  The extra ocular movements are full, there is no significant nystagmus; visual fields are full; there is a mild ptosis involving the left eye but otherwise upper and lower facial muscles are normal in strength and symmetric, there is no flattening of the nasolabial folds; tongue is midline; uvula is midline; shoulder elevation is normal.  MOTOR: Normal tone, bulk and strength; no pronator drift.  COORDINATION: Left finger to nose is normal, right finger to nose is normal, No rest tremor; no intention tremor; no postural tremor; no bradykinesia.  REFLEXES: Deep tendon reflexes are symmetrical and normal.  There is no drift of the upper or lower extremities.  SENSATION: There is reduced sensation to temperature light touch involving the right upper extremity, right trunk and right leg.  GAIT: Normal.    NIH stroke scale 2.  Blood pressure 116/63, pulse 83, temperature 98.3 F (36.8 C), temperature source Oral, resp. rate 20, height 5\' 10"  (1.778 m), weight 91.2 kg, SpO2 95 %.  Past Medical History:  Diagnosis Date  . COPD (chronic obstructive pulmonary disease) (HCC)     Past Surgical History:  Procedure Laterality Date  . ORTHOPEDIC SURGERY    . vascetomy      History reviewed. No pertinent family history.  Social History:  reports that he has been smoking. He has been smoking  about 2.00 packs per day. He has never used smokeless tobacco. He reports current alcohol use of about 5.0 standard drinks of alcohol per week. No history on file for drug.  Allergies:  Allergies  Allergen Reactions  . Morphine And Related Nausea And Vomiting    Medications: Prior to Admission medications   Medication Sig Start Date End Date Taking? Authorizing Provider  albuterol (VENTOLIN HFA) 108 (90 Base) MCG/ACT inhaler Inhale 2 puffs into the lungs every 6 (six) hours as needed. 12/13/19  Yes [provider]  ALPRAZolam Duanne Moron) 1 MG tablet Take 1 mg by mouth 2 (two) times daily as needed for anxiety.   Yes [provider]  aspirin EC 81 MG tablet Take 81 mg by mouth daily.   Yes [provider]  diclofenac Sodium (VOLTAREN) 1 % GEL Apply 1 application topically in the morning, at noon, in the evening, and at bedtime.   Yes [provider]  Pseudoephedrine-Acetaminophen (PSEUDOEPHEDRINE SINUS PO) Take 1 tablet by mouth 3 (three) times daily.   Yes [provider]  SUBOXONE 4-1 MG FILM Place 1 strip under the tongue 2 (two) times daily. 01/15/20  Yes [provider]  hydrOXYzine (ATARAX/VISTARIL) 25 MG tablet Take 25 mg by mouth 2 (two) times daily as needed. 01/09/20   [provider]  omeprazole (PRILOSEC) 20 MG capsule Take 20 mg by mouth daily. 11/01/19   [provider]    Scheduled Meds: . aspirin  300 mg Rectal Daily   Or  . aspirin  325 mg Oral Daily  . buprenorphine-naloxone  2 tablet Sublingual BID  . enoxaparin (LOVENOX) injection  40 mg Subcutaneous Q24H  . nicotine  21 mg Transdermal Once  . nicotine  21 mg Transdermal Daily  . pantoprazole  40 mg Oral Daily   Continuous Infusions: . sodium chloride 75 mL/hr at 01/17/20 0813   PRN Meds:.acetaminophen **OR** acetaminophen (TYLENOL) oral liquid 160 mg/5 mL **OR** acetaminophen, ALPRAZolam, senna-docusate     Results for orders placed or performed during the hospital encounter of 01/16/20 (from the past 48 hour(s))  CBC with Differential     Status: Abnormal   Collection Time: 01/16/20 11:46 AM  Result Value Ref Range   WBC 10.5 4.0 - 10.5 K/uL   RBC 4.82 4.22 - 5.81 MIL/uL   Hemoglobin 15.3 13.0 - 17.0 g/dL   HCT 45.6 39.0 - 52.0 %   MCV 94.6 80.0 - 100.0 fL   MCH 31.7 26.0 - 34.0 pg   MCHC 33.6 30.0 - 36.0 g/dL   RDW 13.3 11.5 - 15.5 %   Platelets 313 150 - 400 K/uL   nRBC 0.0 0.0 - 0.2 %   Neutrophils Relative % 65 %   Neutro Abs 6.8 1.7 - 7.7  K/uL   Lymphocytes Relative 21 %   Lymphs Abs 2.2 0.7 - 4.0 K/uL   Monocytes Relative 9 %   Monocytes Absolute 0.9 0.1 - 1.0 K/uL   Eosinophils Relative 3 %   Eosinophils Absolute 0.4 0.0 - 0.5 K/uL   Basophils Relative 1 %   Basophils Absolute 0.1 0.0 - 0.1 K/uL   Immature Granulocytes 1 %   Abs Immature Granulocytes 0.11 (H) 0.00 - 0.07 K/uL    Comment: Performed at [facility], [address] [place], [place] [address]  Basic metabolic panel     Status: Abnormal   Collection Time: 01/16/20 11:46 AM  Result Value Ref Range   Sodium 137 135 - 145  mmol/L   Potassium 4.3 3.5 - 5.1 mmol/L   Chloride 104 98 - 111 mmol/L   CO2 24 22 - 32 mmol/L   Glucose, Bld 117 (H) 70 - 99 mg/dL   BUN 8 6 - 20 mg/dL   Creatinine, Ser 7.26 0.61 - 1.24 mg/dL   Calcium 8.9 8.9 - 20.3 mg/dL   GFR calc non Af Amer >60 >60 mL/min   GFR calc Af Amer >60 >60 mL/min   Anion gap 9 5 - 15    Comment: Performed at [facility], [address] [place], [place] [address]  Urine rapid drug screen (hosp performed)     Status: Abnormal   Collection Time: 01/16/20  2:30 PM  Result Value Ref Range   Opiates NONE DETECTED NONE DETECTED   Cocaine POSITIVE (A) NONE DETECTED   Benzodiazepines POSITIVE (A) NONE DETECTED   Amphetamines NONE DETECTED NONE DETECTED   Tetrahydrocannabinol NONE DETECTED NONE DETECTED   Barbiturates NONE DETECTED NONE DETECTED    Comment: (NOTE) DRUG SCREEN FOR MEDICAL PURPOSES ONLY.  IF CONFIRMATION IS NEEDED FOR ANY PURPOSE, NOTIFY LAB WITHIN 5 DAYS. LOWEST DETECTABLE LIMITS FOR URINE DRUG SCREEN Drug Class                     Cutoff (ng/mL) Amphetamine and metabolites    1000 Barbiturate and metabolites    200 Benzodiazepine                 200 Tricyclics and metabolites     300 Opiates and metabolites        300 Cocaine and metabolites        300 THC                            50 Performed at [facility], [address], [place], [place] [address]   Urinalysis, Routine w  reflex microscopic     Status: Abnormal   Collection Time: 01/16/20  2:30 PM  Result Value Ref Range   Color, Urine STRAW (A) YELLOW   APPearance CLEAR CLEAR   Specific Gravity, Urine 1.003 (L) 1.005 - 1.030   pH 7.0 5.0 - 8.0   Glucose, UA NEGATIVE NEGATIVE mg/dL   Hgb urine dipstick NEGATIVE NEGATIVE   Bilirubin Urine NEGATIVE NEGATIVE   Ketones, ur NEGATIVE NEGATIVE mg/dL   Protein, ur NEGATIVE NEGATIVE mg/dL   Nitrite NEGATIVE NEGATIVE   Leukocytes,Ua NEGATIVE NEGATIVE    Comment: Performed at [facility], [address] [place], [place] [address]  Ethanol     Status: None   Collection Time: 01/16/20  3:13 PM  Result Value Ref Range   Alcohol, Ethyl (B) <10 <10 mg/dL    Comment: (NOTE) Lowest detectable limit for serum alcohol is 10 mg/dL. For medical purposes only. Performed at [facility], [address], [place], [place] [address]   Protime-INR     Status: None   Collection Time: 01/16/20  3:13 PM  Result Value Ref Range   Prothrombin Time 12.9 11.4 - 15.2 seconds   INR 1.0 0.8 - 1.2    Comment: (NOTE) INR goal varies based on device and disease states. Performed at [facility], [address] [place], [place] [address]   APTT     Status: None   Collection Time: 01/16/20  3:13 PM  Result Value Ref Range   aPTT 25 24 - 36 seconds    Comment: Performed at [facility], [address], [place], [place] [address]  SARS CORONAVIRUS 2 (TAT 6-24 HRS) Nasopharyngeal Nasopharyngeal Swab     Status: None   Collection Time: 01/16/20  3:35 PM   Specimen: Nasopharyngeal Swab  Result Value Ref Range   SARS Coronavirus 2 NEGATIVE NEGATIVE    Comment: (NOTE) SARS-CoV-2 target nucleic acids are NOT DETECTED. The SARS-CoV-2 RNA is generally detectable in upper and lower respiratory specimens during the acute phase of infection. Negative results do not preclude SARS-CoV-2 infection, do not rule out co-infections with other pathogens, and should not be used as the sole basis for  treatment or other patient management decisions. Negative results must be combined with clinical observations, patient history, and epidemiological information. The expected result is Negative. Fact Sheet for Patients: HairSlick.no Fact Sheet for Healthcare Providers: quierodirigir.com This test is not yet approved or cleared by the [place] FDA and  has been authorized for detection and/or diagnosis of SARS-CoV-2 by FDA under an Emergency Use Authorization (EUA). This EUA will remain  in effect (meaning this test can be used) for the duration of the COVID-19 declaration under Section 56 4(b)(1) of the Act, 21 U.S.C. section 360bbb-3(b)(1), unless the authorization is terminated or revoked sooner. Performed at [facility], [address], [place], [place] [address]   Lipid panel     Status: Abnormal   Collection Time: 01/17/20  4:46 AM  Result Value Ref Range   Cholesterol 168 0 - 200 mg/dL   Triglycerides 834 (H) <150 mg/dL   HDL 33 (L) >19 mg/dL   Total CHOL/HDL Ratio 5.1 RATIO   VLDL 67 (H) 0 - 40 mg/dL   LDL Cholesterol 68 0 - 99 mg/dL    Comment:        Total Cholesterol/HDL:CHD Risk Coronary Heart Disease Risk Table                     Men   Women  1/2 Average Risk   3.4   3.3  Average Risk       5.0   4.4  2 X Average Risk   9.6   7.1  3 X Average Risk  23.4   11.0        Use the calculated Patient Ratio above and the CHD Risk Table to determine the patient's CHD Risk.        ATP III CLASSIFICATION (LDL):  <100     mg/dL   Optimal  622-297  mg/dL   Near or Above                    Optimal  130-159  mg/dL   Borderline  989-211  mg/dL   High  >941     mg/dL   Very High Performed at [facility], [address] [place], [place] [address]     Studies/Results:  HEAD NECK CTA FINDINGS: CT HEAD FINDINGS  Brain: The brain shows a normal appearance without evidence of malformation, atrophy, old  or acute small or large vessel infarction, mass lesion, hemorrhage, hydrocephalus or extra-axial collection.  Vascular: No hyperdense vessel. No evidence of atherosclerotic calcification.  Skull: Normal.  No traumatic finding.  No focal bone lesion.  Sinuses/Orbits: Chronic inflammatory changes of the left paranasal sinuses.  Other: None significant  CTA NECK FINDINGS  Aortic arch: Normal  Right carotid system: Normal. No atherosclerotic disease at the carotid bifurcation.  Left carotid system: Normal. No atherosclerotic disease at the carotid  bifurcation.  Vertebral arteries: Both vertebral arteries widely patent at their origins and through the cervical region to the foramen magnum. Right is dominant.  Skeleton: Cervical spondylosis.  Other neck: No mass or lymphadenopathy. Level 2 lymph node on the left at the upper limits of normal in size. [place]ma prominent tissue at the tongue base on the left. Consider direct inspection.  Upper chest: Emphysema.  No acute process.  Review of the MIP images confirms the above findings  CTA HEAD FINDINGS  Anterior circulation: Both internal carotid arteries widely patent through the skull base and siphon regions. The anterior and middle cerebral vessels are patent. No large or medium vessel occlusion. No correctable proximal stenosis.  Posterior circulation: Both vertebral arteries patent through the foramen magnum. Right vertebral supplies the basilar. Left vertebral terminates in PICA. Mild stenosis of the basilar artery. Posterior circulation branch vessels are patent. Left PCA takes fetal origin from the anterior circulation.  Venous sinuses: Patent  Anatomic variants: None  Review of the MIP images confirms the above findings  IMPRESSION: No intracranial large or medium vessel occlusion.  No carotid bifurcation disease.  Mild stenosis of the basilar artery, not likely  significant.  Emphysema.  Question prominence soft tissue at the tongue base on the left. Consider direct inspection to rule out tongue base mass. Borderline prominent level 2 lymph node on the left, but not out of the range of normal, short axis dimension 1 cm.      BRAIN MRI MRA FINDINGS: MRI HEAD FINDINGS  Brain: Focal is of restricted diffusion is seen within the left posterolateral aspect of the medulla with associated increased T2 signal, consistent with acute/subacute infarct. Remainder of the brain parenchyma appear grossly unremarkable. No hydrocephalus, intracranial hemorrhage or midline shift.  Vascular: Normal flow voids.  Skull and upper cervical spine: Normal marrow signal.  Sinuses/Orbits: Negative.  Other: None.  MRA HEAD FINDINGS  The visualized portions of the distal cervical internal carotid arteries are widely patent with antegrade flow. The petrous, cavernous, and supra clinoid segments are symmetric in caliber bilaterally. A1 segments, anterior communicating artery, and anterior cerebral arteries are widely patent. Middle cerebral arteries are widely patent with antegrade flow without high-grade flow-limiting stenosis or proximal branch occlusion. Distal MCA branches are symmetric bilaterally. No intracranial aneurysm within the anterior circulation.  No flow related enhancement is appreciated of the left the vertebral artery, may be related to slow flow in a hypoplastic artery as seen on prior CTA; although occlusion cannot be excluded. The right vertebral artery is widely patent with antegrade flow. The P1 segment of the left vertebral artery is hypoplastic noting a prominent left posterior communicating artery. The posterior cerebral arteries are otherwise unremarkable. Basilar artery is widely patent with normal flow related enhancement. No intracranial aneurysm within the posterior circulation.  The study is degraded by  patient's motion.  Brain: Reported above.  IMPRESSION: 1. 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 2. Absent flow related enhancement in the left vertebral artery, may be related to slow flow in a hypoplastic artery as seen on prior CTA; although occlusion is cannot be excluded. 3. Otherwise unremarkable MRI of the brain.        The brain MRI is reviewed in person in shows increased signal on DWI involving the lateral medulla on the left side. This associated with reduced signal on the ADC scan. It also extends to the inferior cerebellar peduncle. There is minimal chronic white matter changes and no  hemorrhage noted. No encephalomalacia.   Makhayla Mcmurry A. Gerilyn Price, M.D.  Diplomate, Biomedical engineer of Psychiatry and Neurology ( Neurology). 01/17/2020, 8:34 AM

## 2020-01-17 NOTE — Evaluation (Signed)
Speech Language Pathology Evaluation Patient Details Name: Justin Price MRN: [id number] DOB: 07-Sep-1961 Today's Date: 01/17/2020 Time: 1207-1230 SLP Time Calculation (min) (ACUTE ONLY): 23 min  Problem List:  Patient Active Problem List   Diagnosis Date Noted  . Acute CVA (cerebrovascular accident) ([place]) 01/17/2020  . CVA (cerebral vascular accident) ([place]) 01/16/2020  . Tobacco abuse 01/16/2020  . Cocaine abuse ([place]) 01/16/2020  . GERD (gastroesophageal reflux disease) 01/16/2020  . Anxiety 01/16/2020  . Dysphagia 01/16/2020   Past Medical History:  Past Medical History:  Diagnosis Date  . COPD (chronic obstructive pulmonary disease) ([place])    Past Surgical History:  Past Surgical History:  Procedure Laterality Date  . ORTHOPEDIC SURGERY    . vascetomy     HPI:  Justin Price is a 59 y.o. male with medical history significant of GERD, tobacco use, anxiety, initially began having symptoms of dysphagia, difficulty swallowing approximately 4 days ago.  He says these are new symptoms, but also says that he has had difficulty swallowing intermittently for quite some time.  He came to the ER for evaluation that evening where CT angio of the head and neck did not show any signs of infarct or large vessel obstruction.  Further work-up including MRI was recommended with transfer down to [facility], patient refused and left the hospital [place].  Upon returning home, following evening he began having symptoms of numbness in his right upper and lower extremity including his right trunk.  He denies extension into his face.  He may have had some blurry vision but is unclear.  He did not have any weakness on that side.  He returned to the hospital today for evaluation.  Vitals are noted to be stable.  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  He has been referred for admission.    Assessment / Plan / Recommendation Clinical  Impression  SLE requested as part of stroke protocol and found to be WNL with the exception of hoarse vocal quality. Pt reports some of this is his baseline, however seems to be worse now. Pt initially presented with dysphagia, but passed the [facility] swallow screen. He tells SLP he needs to "slow down". SLP encouraged Pt to monitor swallowing given location of his stroke (medulla). He was observed drinking thin liquids during SLE without incident. Pt's only current deficits appear sensory deficits on the right side (arm/leg). No further SLP services indicated at this time. SLP will sign off.     SLP Assessment  SLP Recommendation/Assessment: Patient does not need any further Speech Lanaguage Pathology Services SLP Visit Diagnosis: Cognitive communication deficit (R41.841)    Follow Up Recommendations  None    Frequency and Duration           SLP Evaluation Cognition  Overall Cognitive Status: Within Functional Limits for tasks assessed Arousal/Alertness: Awake/alert Orientation Level: Oriented X4 Memory: Appears intact Awareness: Appears intact Problem Solving: Appears intact Safety/Judgment: Appears intact       Comprehension  Auditory Comprehension Overall Auditory Comprehension: Appears within functional limits for tasks assessed Yes/No Questions: Within Functional Limits Commands: Within Functional Limits Conversation: Complex Visual Recognition/Discrimination Discrimination: Not tested Reading Comprehension Reading Status: Not tested    Expression Expression Primary Mode of Expression: Verbal Verbal Expression Overall Verbal Expression: Appears within functional limits for tasks assessed Initiation: No impairment Automatic Speech: Name;Social Response Level of Generative/Spontaneous Verbalization: Conversation Repetition: No impairment Naming: No impairment Pragmatics: No impairment Non-Verbal  Means of Communication: Not applicable Written Expression Dominant Hand:  Right   Oral / Motor  Motor Speech Overall Motor Speech: Impaired Respiration: Within functional limits Phonation: Hoarse Resonance: Within functional limits Articulation: Within functional limitis Intelligibility: Intelligible Motor Planning: Witnin functional limits Motor Speech Errors: Not applicable   Thank you,  [name], CCC-SLP [phone]                     [name] 01/17/2020, 2:15 PM

## 2020-01-17 NOTE — Discharge Summary (Signed)
Justin Price, is a 59 y.o. male  DOB 04/09/1961  MRN [id number].  Admission date:  01/16/2020  Admitting Physician  Shon Hale, MD  Discharge Date:  01/17/2020   Primary MD  Kara Pacer, NP  Recommendations for primary care physician for things to follow:   1)Please take Aspirin 81 mg daily along with Plavix 75 mg daily for 30 days then after that STOP the aspirin and continue ONLY Plavix 75 mg daily indefinitely--for stroke prevention  2)Avoid ibuprofen/Advil/Aleve/Motrin/Goody Powders/Naproxen/BC powders/Meloxicam/Diclofenac/Indomethacin and other Nonsteroidal anti-inflammatory medications as these will make you more likely to bleed and can cause stomach ulcers, can also cause Kidney problems.   3) smoking cessation advised/complete abstinence from tobacco strongly advised  4) complete abstinence from cocaine and other street drugs strongly advised  5)Please take your medications as prescribed to reduce your risk for another stroke and heart attacks  6)Please follow-up with Neurologist Dr. Beryle Beams-- Phone: [phone], Address: [address], [place], [place] [address] in 4 to 6 weeks for recheck and reevaluation.  Please call to make appointment with him  Admission Diagnosis  Substance abuse (HCC) [F19.10] CVA (cerebral vascular accident) (HCC) [I63.9] Cerebrovascular accident (CVA), unspecified mechanism (HCC) [I63.9] Acute CVA (cerebrovascular accident) [facility]) [I63.9]   Discharge Diagnosis  Substance abuse (HCC) [F19.10] CVA (cerebral vascular accident) (HCC) [I63.9] Cerebrovascular accident (CVA), unspecified mechanism (HCC) [I63.9] Acute CVA (cerebrovascular accident) (HCC) [I63.9]    Active Problems:   CVA (cerebral vascular accident) (HCC)   Tobacco abuse   Cocaine abuse (HCC)   GERD (gastroesophageal reflux disease)   Anxiety   Dysphagia   Acute CVA  (cerebrovascular accident) [facility])      Past Medical History:  Diagnosis Date  . COPD (chronic obstructive pulmonary disease) (HCC)     Past Surgical History:  Procedure Laterality Date  . ORTHOPEDIC SURGERY    . vascetomy       HPI  from the history and physical done on the day of admission:    I have personally briefly reviewed patient's old medical records in [facility]  Chief Complaint: Difficulty swallowing, right-sided numbness  HPI: Justin Price is a 59 y.o. male with medical history significant of GERD, tobacco use, anxiety, initially began having symptoms of dysphagia, difficulty swallowing approximately 4 days ago.  He says these are new symptoms, but also says that he has had difficulty swallowing intermittently for quite some time.  He came to the ER for evaluation that evening where CT angio of the head and neck did not show any signs of infarct or large vessel obstruction.  Further work-up including MRI was recommended with transfer down to [facility], patient refused and left the hospital AGAINST MEDICAL ADVICE.  Upon returning home, following evening he began having symptoms of numbness in his right upper and lower extremity including his right trunk.  He denies extension into his face.  He may have had some blurry vision but is unclear.  He did not have any weakness on  that side.  He returned to the hospital today for evaluation.  Vitals are noted to be stable.  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  He has been referred for admission.   Review of Systems: As per HPI otherwise 10 point review of systems negative.  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  This is most likely a small vessel event given the presentation and imaging.  Risk factors are age and cocaine use -Neurology input/consult appreciated, echo noted as below #2, discharged home on aspirin and Plavix then monotherapy after 4 weeks--- patient follow-up with neurologist advised -Abstinence from cocaine, street drugs and tobacco advised -LDL 68, HDL is 33, use Lipitor as prescribed -Even if his lipid panel is within desired limits, patient should still take Lipitor/Statin for it's Pleiotropic effects (beyond cholesterol lowering benefits) -Is 5.6  2)HFpEF--EF 65 to 64%, diastolic dysfunction noted, appears compensated  3) polysubstance abuse----abstinence from cocaine, illicit drugs and tobacco strongly encouraged  4) chronic opiate therapy--- patient uses Suboxone  5)Anxiety.  Continue on Ativan  Discharge Condition: stable  Follow UP--- neurologist as outpatient as advised   Consults obtained -neurology  Diet and Activity recommendation:  As advised  Discharge Instructions    Discharge Instructions    Call MD for:  difficulty breathing, headache or visual disturbances   Complete by: As directed    Call MD for:  persistant dizziness or light-headedness   Complete by: As directed    Call MD for:  persistant nausea and vomiting   Complete by: As directed    Call MD for:  severe uncontrolled pain   Complete by: As directed    Call MD for:  temperature >100.4   Complete by: As directed    Diet - low sodium heart healthy   Complete by: As directed    Discharge instructions   Complete by: As directed    1)Please take Aspirin 81 mg daily along with Plavix 75 mg daily for 30 days then after that STOP the aspirin and continue ONLY Plavix 75 mg daily indefinitely--for stroke prevention  2)Avoid ibuprofen/Advil/Aleve/Motrin/Goody Powders/Naproxen/BC powders/Meloxicam/Diclofenac/Indomethacin and other Nonsteroidal anti-inflammatory medications as these will  make you more likely to bleed and can cause stomach ulcers, can also cause Kidney problems.   3) smoking cessation advised/complete abstinence from tobacco strongly advised  4) complete abstinence from cocaine and other street drugs strongly advised  5)Please take your medications as prescribed to reduce your risk for another stroke and heart attacks  6)Please follow-up with Neurologist Dr. Phillips Odor-- Phone: [phone], Address: [address], [place], [place] [address] in 4 to 6 weeks for recheck and reevaluation.  Please call to make appointment with him   Increase activity slowly   Complete by: As directed         Discharge Medications     Allergies as of 01/17/2020      Reactions   Morphine And Related Nausea And Vomiting      Medication List    STOP taking these medications   PSEUDOEPHEDRINE SINUS PO     TAKE these medications   acetaminophen 325 MG tablet Commonly known as: TYLENOL Take 2 tablets (650 mg total) by  mouth every 4 (four) hours as needed for mild pain or headache (or temp > 37.5 C (99.5 F)).   albuterol 108 (90 Base) MCG/ACT inhaler Commonly known as: VENTOLIN HFA Inhale 2 puffs into the lungs every 6 (six) hours as needed.   ALPRAZolam 1 MG tablet Commonly known as: XANAX Take 1 mg by mouth 2 (two) times daily as needed for anxiety.   aspirin EC 81 MG tablet Take 1 tablet (81 mg total) by mouth daily with breakfast. Take Aspirin 81 mg daily along with Plavix 75 mg daily for 30 days then after that STOP the aspirin and continue ONLY Plavix 75 mg daily indefinitely What changed:   when to take this  additional instructions   atorvastatin 40 MG tablet Commonly known as: Lipitor Take 1 tablet (40 mg total) by mouth every evening. For stroke prevention   clopidogrel 75 MG tablet Commonly known as: Plavix Take 1 tablet (75 mg total) by mouth daily. Take Aspirin 81 mg daily along with Plavix 75 mg daily for 30 days then after that  STOP the aspirin and continue ONLY Plavix 75 mg daily indefinitely--for stroke prevention   diclofenac Sodium 1 % Gel Commonly known as: VOLTAREN Apply 1 application topically in the morning, at noon, in the evening, and at bedtime.   hydrOXYzine 25 MG tablet Commonly known as: ATARAX/VISTARIL Take 25 mg by mouth 2 (two) times daily as needed.   nicotine 14 mg/24hr patch Commonly known as: NICODERM CQ - dosed in mg/24 hours Place 1 patch (14 mg total) onto the skin daily.   omeprazole 20 MG capsule Commonly known as: PRILOSEC Take 20 mg by mouth daily.   Suboxone 4-1 MG Film Generic drug: Buprenorphine HCl-Naloxone HCl Place 1 strip under the tongue 2 (two) times daily.       Major procedures and Radiology Reports - PLEASE review detailed and final reports for all details, in brief -   CT Angio Head W or Wo Contrast  Result Date: 01/13/2020 CLINICAL DATA:  Swallowing difficulty beginning today. EXAM: CT ANGIOGRAPHY HEAD AND NECK TECHNIQUE: Multidetector CT imaging of the head and neck was performed using the standard protocol during bolus administration of intravenous contrast. Multiplanar CT image reconstructions and MIPs were obtained to evaluate the vascular anatomy. Carotid stenosis measurements (when applicable) are obtained utilizing NASCET criteria, using the distal internal carotid diameter as the denominator. CONTRAST:  OMNIPAQUE IOHEXOL 350 MG/ML SOLN COMPARISON:  None. FINDINGS: CT HEAD FINDINGS Brain: The brain shows a normal appearance without evidence of malformation, atrophy, old or acute small or large vessel infarction, mass lesion, hemorrhage, hydrocephalus or extra-axial collection. Vascular: No hyperdense vessel. No evidence of atherosclerotic calcification. Skull: Normal.  No traumatic finding.  No focal bone lesion. Sinuses/Orbits: Chronic inflammatory changes of the left paranasal sinuses. Other: None significant CTA NECK FINDINGS Aortic arch: Normal Right  carotid system: Normal. No atherosclerotic disease at the carotid bifurcation. Left carotid system: Normal. No atherosclerotic disease at the carotid bifurcation. Vertebral arteries: Both vertebral arteries widely patent at their origins and through the cervical region to the foramen magnum. Right is dominant. Skeleton: Cervical spondylosis. Other neck: No mass or lymphadenopathy. Level 2 lymph node on the left at the upper limits of normal in size. Summa prominent tissue at the tongue base on the left. Consider direct inspection. Upper chest: Emphysema.  No acute process. Review of the MIP images confirms the above findings CTA HEAD FINDINGS Anterior circulation: Both internal carotid arteries widely patent through  the skull base and siphon regions. The anterior and middle cerebral vessels are patent. No large or medium vessel occlusion. No correctable proximal stenosis. Posterior circulation: Both vertebral arteries patent through the foramen magnum. Right vertebral supplies the basilar. Left vertebral terminates in PICA. Mild stenosis of the basilar artery. Posterior circulation branch vessels are patent. Left PCA takes fetal origin from the anterior circulation. Venous sinuses: Patent Anatomic variants: None Review of the MIP images confirms the above findings IMPRESSION: No intracranial large or medium vessel occlusion. No carotid bifurcation disease. 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 Emphysema. Question prominence soft tissue at the tongue base on the left. Consider direct inspection to rule out tongue base mass. Borderline prominent level 2 lymph node on the left, but not out of the range of normal, short axis dimension 1 cm. Electronically Signed   By: Paulina Fusi M.D.   On: 01/13/2020 02:14   CT Angio Neck W and/or Wo Contrast  Result Date: 01/13/2020 CLINICAL DATA:  Swallowing difficulty beginning today. EXAM: CT ANGIOGRAPHY HEAD AND NECK TECHNIQUE: Multidetector CT imaging  of the head and neck was performed using the standard protocol during bolus administration of intravenous contrast. Multiplanar CT image reconstructions and MIPs were obtained to evaluate the vascular anatomy. Carotid stenosis measurements (when applicable) are obtained utilizing NASCET criteria, using the distal internal carotid diameter as the denominator. CONTRAST:  OMNIPAQUE IOHEXOL 350 MG/ML SOLN COMPARISON:  None. FINDINGS: CT HEAD FINDINGS Brain: The brain shows a normal appearance without evidence of malformation, atrophy, old or acute small or large vessel infarction, mass lesion, hemorrhage, hydrocephalus or extra-axial collection. Vascular: No hyperdense vessel. No evidence of atherosclerotic calcification. Skull: Normal.  No traumatic finding.  No focal bone lesion. Sinuses/Orbits: Chronic inflammatory changes of the left paranasal sinuses. Other: None significant CTA NECK FINDINGS Aortic arch: Normal Right carotid system: Normal. No atherosclerotic disease at the carotid bifurcation. Left carotid system: Normal. No atherosclerotic disease at the carotid bifurcation. Vertebral arteries: Both vertebral arteries widely patent at their origins and through the cervical region to the foramen magnum. Right is dominant. Skeleton: Cervical spondylosis. Other neck: No mass or lymphadenopathy. Level 2 lymph node on the left at the upper limits of normal in size. Summa prominent tissue at the tongue base on the left. Consider direct inspection. Upper chest: Emphysema.  No acute process. Review of the MIP images confirms the above findings CTA HEAD FINDINGS Anterior circulation: Both internal carotid arteries widely patent through the skull base and siphon regions. The anterior and middle cerebral vessels are patent. No large or medium vessel occlusion. No correctable proximal stenosis. Posterior circulation: Both vertebral arteries patent through the foramen magnum. Right vertebral supplies the basilar. Left  vertebral terminates in PICA. Mild stenosis of the basilar artery. Posterior circulation branch vessels are patent. Left PCA takes fetal origin from the anterior circulation. Venous sinuses: Patent Anatomic variants: None Review of the MIP images confirms the above findings IMPRESSION: No intracranial large or medium vessel occlusion. No carotid bifurcation disease. 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 Emphysema. Question prominence soft tissue at the tongue base on the left. Consider direct inspection to rule out tongue base mass. Borderline prominent level 2 lymph node on the left, but not out of the range of normal, short axis dimension 1 cm. Electronically Signed   By: Paulina Fusi M.D.   On: 01/13/2020 02:14   MR ANGIO HEAD WO CONTRAST  Result Date: 01/16/2020 CLINICAL DATA:  Dysphagia, helical pupils, numbness on right side. EXAM: MRI HEAD WITHOUT AND WITH CONTRAST MRA HEAD WITHOUT AND WITH CONTRAST TECHNIQUE: Multiplanar, multiecho pulse sequences of the brain and surrounding structures were obtained without and with intravenous contrast. Angiographic images of the head were obtained using MRA technique without and with contrast. COMPARISON:  None. CT angiogram of the head and neck January 13, 2020 FINDINGS: MRI HEAD FINDINGS Brain: Focal is of restricted diffusion is seen within the left posterolateral aspect of the medulla with associated increased T2 signal, consistent with acute/subacute infarct. Remainder of the brain parenchyma appear grossly unremarkable. No hydrocephalus, intracranial hemorrhage or midline shift. Vascular: Normal flow voids. Skull and upper cervical spine: Normal marrow signal. Sinuses/Orbits: Negative. Other: None. MRA HEAD FINDINGS The visualized portions of the distal cervical internal carotid arteries are widely patent with antegrade flow. The petrous, cavernous, and supra clinoid segments are symmetric in caliber bilaterally. A1 segments, anterior  communicating artery, and anterior cerebral arteries are widely patent. Middle cerebral arteries are widely patent with antegrade flow without high-grade flow-limiting stenosis or proximal branch occlusion. Distal MCA branches are symmetric bilaterally. No intracranial aneurysm within the anterior circulation. No flow related enhancement is appreciated of the left the vertebral artery, may be related to slow flow in a hypoplastic artery as seen on prior CTA; although occlusion cannot be excluded. The right vertebral artery is widely patent with antegrade flow. The P1 segment of the left vertebral artery is hypoplastic noting a prominent left posterior communicating artery. The posterior cerebral arteries are otherwise unremarkable. Basilar artery is widely patent with normal flow related enhancement. No intracranial aneurysm within the posterior circulation. The study is degraded by patient's motion. Brain: Reported above. IMPRESSION: 1. 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 2. Absent flow related enhancement in the left vertebral artery, may be related to slow flow in a hypoplastic artery as seen on prior CTA; although occlusion is cannot be excluded. 3. Otherwise unremarkable MRI of the brain. Results were communicated by phone to Dr. Judd Lien at 2:06 p.m. on May 16, 2019 Electronically Signed   By: Baldemar Lenis M.D.   On: 01/16/2020 14:11   MR BRAIN WO CONTRAST  Result Date: 01/16/2020 CLINICAL DATA:  Dysphagia, helical pupils, numbness on right side. EXAM: MRI HEAD WITHOUT AND WITH CONTRAST MRA HEAD WITHOUT AND WITH CONTRAST TECHNIQUE: Multiplanar, multiecho pulse sequences of the brain and surrounding structures were obtained without and with intravenous contrast. Angiographic images of the head were obtained using MRA technique without and with contrast. COMPARISON:  None. CT angiogram of the head and neck January 13, 2020 FINDINGS: MRI HEAD  FINDINGS Brain: Focal is of restricted diffusion is seen within the left posterolateral aspect of the medulla with associated increased T2 signal, consistent with acute/subacute infarct. Remainder of the brain parenchyma appear grossly unremarkable. No hydrocephalus, intracranial hemorrhage or midline shift. Vascular: Normal flow voids. Skull and upper cervical spine: Normal marrow signal. Sinuses/Orbits: Negative. Other: None. MRA HEAD FINDINGS The visualized portions of the distal cervical internal carotid arteries are widely patent with antegrade flow. The petrous, cavernous, and supra clinoid segments are symmetric in caliber bilaterally. A1 segments, anterior communicating artery, and anterior cerebral arteries are widely patent. Middle cerebral arteries are widely patent with antegrade flow without high-grade flow-limiting stenosis or proximal branch occlusion. Distal MCA branches are symmetric bilaterally. No intracranial aneurysm within the anterior circulation. No flow related enhancement is appreciated of the left the vertebral artery, may be  related to slow flow in a hypoplastic artery as seen on prior CTA; although occlusion cannot be excluded. The right vertebral artery is widely patent with antegrade flow. The P1 segment of the left vertebral artery is hypoplastic noting a prominent left posterior communicating artery. The posterior cerebral arteries are otherwise unremarkable. Basilar artery is widely patent with normal flow related enhancement. No intracranial aneurysm within the posterior circulation. The study is degraded by patient's motion. Brain: Reported above. IMPRESSION: 1. 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 2. Absent flow related enhancement in the left vertebral artery, may be related to slow flow in a hypoplastic artery as seen on prior CTA; although occlusion is cannot be excluded. 3. Otherwise unremarkable MRI of the brain. Results were  communicated by phone to Dr. Judd Lien at 2:06 p.m. on May 16, 2019 Electronically Signed   By: Baldemar Lenis M.D.   On: 01/16/2020 14:11   DG Chest Portable 1 View  Result Date: 01/13/2020 CLINICAL DATA:  Dysphagia for 2 days EXAM: PORTABLE CHEST 1 VIEW COMPARISON:  August 21, 2019 FINDINGS: The heart size and mediastinal contours are within normal limits. Both lungs are clear. The visualized skeletal structures are unremarkable. IMPRESSION: No active disease. Electronically Signed   By: Jonna Clark M.D.   On: 01/13/2020 02:07   ECHOCARDIOGRAM COMPLETE  Result Date: 01/17/2020    ECHOCARDIOGRAM REPORT   Patient Name:   Justin Price Date of Exam: 01/17/2020 Medical Rec #:  [id number]       Height:       70.0 in Accession #:    [id number]      Weight:       201.1 lb Date of Birth:  Oct 17, 1961       BSA:          2.09 m Patient Age:    58 years        BP:           116/63 mmHg Patient Gender: M               HR:           83 bpm. [facility] Procedure: 2D Echo, Cardiac Doppler and Color Doppler Indications:    Stroke 434.91 / I163.9  History:        Patient has no prior history of Echocardiogram examinations.                 COPD; Risk Factors:Current Smoker. GERD (gastroesophageal reflux                 disease),Cocaine abuse.  Sonographer:    Celesta Gentile RCS Referring Phys: [phone] JEHANZEB MEMON IMPRESSIONS  1. Left ventricular ejection fraction, by estimation, is 65 to 70%. The left ventricle has hyperdynamic function. The left ventrical has no regional wall motion abnormalities. There is mildly increased left ventricular hypertrophy. Left ventricular diastolic parameters are consistent with Grade I diastolic dysfunction (impaired relaxation).  2. Right ventricular systolic function is normal. The right ventricular size is normal.  3. No left atrial/left atrial appendage thrombus was detected.  4. The mitral valve is normal in structure and function. no evidence of mitral valve  regurgitation. No evidence of mitral stenosis.  5. The aortic valve is tricuspid. Aortic valve regurgitation is not visualized. No aortic stenosis is present.  6. The inferior vena cava is normal in size with greater than 50% respiratory variability, suggesting right atrial pressure  of 3 mmHg. FINDINGS  Left Ventricle: Left ventricular ejection fraction, by estimation, is 65 to 70%. The left ventricle has hyperdynamic function. The left ventricle has no regional wall motion abnormalities. There is mildly increased. There is mildly increased left ventricular hypertrophy. Left ventricular diastolic parameters are consistent with Grade I diastolic dysfunction (impaired relaxation). Right Ventricle: The right ventricular size is normal. No increase in right ventricular wall thickness. Right ventricular systolic function is normal. Left Atrium: Left atrial size was normal in size. Right Atrium: Right atrial size was normal in size. Pericardium: There is no evidence of pericardial effusion. Mitral Valve: The mitral valve is normal in structure and function. No evidence of mitral valve regurgitation. No evidence of mitral valve stenosis. Tricuspid Valve: The tricuspid valve is normal in structure. Tricuspid valve regurgitation is not demonstrated. No evidence of tricuspid stenosis. Aortic Valve: The aortic valve is tricuspid. Aortic valve regurgitation is not visualized. No aortic stenosis is present. Aortic valve mean gradient measures 3.1 mmHg. Aortic valve peak gradient measures 6.3 mmHg. Aortic valve area, by VTI measures 3.27 cm. Pulmonic Valve: The pulmonic valve was not well visualized. Pulmonic valve regurgitation is not visualized. No evidence of pulmonic stenosis. Aorta: The aortic root is normal in size and structure. Pulmonary Artery: Indeterminant PASP, inadequate TR jet. Venous: The inferior vena cava is normal in size with greater than 50% respiratory variability, suggesting right atrial pressure of 3 mmHg.  IAS/Shunts: No atrial level shunt detected by color flow Doppler.  LEFT VENTRICLE PLAX 2D LVIDd:         3.69 cm       Diastology LVIDs:         2.55 cm       LV e' lateral:   7.72 cm/s LV PW:         1.03 cm       LV E/e' lateral: 7.9 LV IVS:        1.21 cm       LV e' medial:    5.77 cm/s LVOT diam:     1.90 cm       LV E/e' medial:  10.6 LV SV:         56.99 ml LV SV Index:   16.01 LVOT Area:     2.84 cm  LV Volumes (MOD) LV area d, A2C:    22.70 cm LV area d, A4C:    21.65 cm LV area s, A2C:    11.50 cm LV area s, A4C:    11.30 cm LV major d, A2C:   7.51 cm LV major d, A4C:   7.16 cm LV major s, A2C:   5.93 cm LV major s, A4C:   5.97 cm LV vol d, MOD A2C: 57.6 ml LV vol d, MOD A4C: 54.4 ml LV vol s, MOD A2C: 19.0 ml LV vol s, MOD A4C: 18.0 ml LV SV MOD A2C:     38.6 ml LV SV MOD A4C:     54.4 ml LV SV MOD BP:      38.5 ml RIGHT VENTRICLE RV S prime:     17.50 cm/s TAPSE (M-mode): 2.7 cm LEFT ATRIUM             Index       RIGHT ATRIUM           Index LA diam:        3.60 cm 1.72 cm/m  RA Area:     12.70 cm LA Vol (A2C):  47.5 ml 22.71 ml/m RA Volume:   27.10 ml  12.95 ml/m LA Vol (A4C):   38.4 ml 18.36 ml/m LA Biplane Vol: 48.1 ml 22.99 ml/m  AORTIC VALVE AV Area (Vmax):    2.46 cm AV Area (Vmean):   2.25 cm AV Area (VTI):     3.27 cm AV Vmax:           125.74 cm/s AV Vmean:          83.787 cm/s AV VTI:            0.174 m AV Peak Grad:      6.3 mmHg AV Mean Grad:      3.1 mmHg LVOT Vmax:         109.00 cm/s LVOT Vmean:        66.600 cm/s LVOT VTI:          0.201 m LVOT/AV VTI ratio: 1.15  AORTA Ao Root diam: 3.20 cm MITRAL VALVE MV Area (PHT): 2.50 cm             SHUNTS MV Decel Time: 303 msec             Systemic VTI:  0.20 m MV E velocity: 61.00 cm/s 103 cm/s  Systemic Diam: 1.90 cm MV A velocity: 88.60 cm/s 70.3 cm/s MV E/A ratio:  0.69       1.5 Dina RichJonathan Branch MD Electronically signed by Dina RichJonathan Branch MD Signature Date/Time: 01/17/2020/12:39:07 PM    Final     Micro Results  Recent  Results (from the past 240 hour(s))  SARS CORONAVIRUS 2 (TAT 6-24 HRS) Nasopharyngeal Nasopharyngeal Swab     Status: None   Collection Time: 01/16/20  3:35 PM   Specimen: Nasopharyngeal Swab  Result Value Ref Range Status   SARS Coronavirus 2 NEGATIVE NEGATIVE Final    Comment: (NOTE) SARS-CoV-2 target nucleic acids are NOT DETECTED. The SARS-CoV-2 RNA is generally detectable in upper and lower respiratory specimens during the acute phase of infection. Negative results do not preclude SARS-CoV-2 infection, do not rule out co-infections with other pathogens, and should not be used as the sole basis for treatment or other patient management decisions. Negative results must be combined with clinical observations, patient history, and epidemiological information. The expected result is Negative. Fact Sheet for Patients: HairSlick.nohttps://www.fda.gov/media/138098/download Fact Sheet for Healthcare Providers: quierodirigir.comhttps://www.fda.gov/media/138095/download This test is not yet approved or cleared by the [place] FDA and  has been authorized for detection and/or diagnosis of SARS-CoV-2 by FDA under an Emergency Use Authorization (EUA). This EUA will remain  in effect (meaning this test can be used) for the duration of the COVID-19 declaration under Section 56 4(b)(1) of the Act, 21 U.S.C. section 360bbb-3(b)(1), unless the authorization is terminated or revoked sooner. Performed at [facility], [address] [place], [place] [address]        Today   Subjective    Justin Price today has no new complaints dysphagia improving          Patient has been seen and examined prior to discharge   Objective   Blood pressure 116/63, pulse 83, temperature 98.3 F (36.8 C), temperature source Oral, resp. rate 20, height 5\' 10"  (1.778 m), weight 91.2 kg, SpO2 95 %.   Intake/Output Summary (Last 24 hours) at 01/17/2020 1400 Last data filed at 01/17/2020 0900 Gross per 24 hour  Intake  1413.12 ml  Output 300 ml  Net 1113.12 ml    Exam Gen:- Awake Alert,  no acute distress  HEENT:- [place].AT, No sclera icterus Neck-Supple Neck,No JVD,.  Lungs-  CTAB , good air movement bilaterally  CV- S1, S2 normal, regular Abd-  +ve B.Sounds, Abd Soft, No tenderness,    Extremity/Skin:- No  edema,   good pulses Psych-affect is appropriate, oriented x3 Neuro-neuro deficits appears to be resolving,  Data Review   CBC w Diff:  Lab Results  Component Value Date   WBC 10.5 01/16/2020   HGB 15.3 01/16/2020   HCT 45.6 01/16/2020   PLT 313 01/16/2020   LYMPHOPCT 21 01/16/2020   MONOPCT 9 01/16/2020   EOSPCT 3 01/16/2020   BASOPCT 1 01/16/2020    CMP:  Lab Results  Component Value Date   NA 137 01/16/2020   K 4.3 01/16/2020   CL 104 01/16/2020   CO2 24 01/16/2020   BUN 8 01/16/2020   CREATININE 0.84 01/16/2020   PROT 7.2 01/13/2020   ALBUMIN 3.9 01/13/2020   BILITOT 0.6 01/13/2020   ALKPHOS 72 01/13/2020   AST 23 01/13/2020   ALT 29 01/13/2020  .   Total Discharge time is about 33 minutes  Shon Haleourage Quinita Kostelecky M.D on 01/17/2020 at 2:00 PM  Go to [email or web address] -  for contact info  [facility] - Office  [phone]

## 2020-01-17 NOTE — Progress Notes (Signed)
*  PRELIMINARY RESULTS* Echocardiogram 2D Echocardiogram has been performed.  Stacey Drain 01/17/2020, 9:18 AM

## 2020-01-17 NOTE — Plan of Care (Signed)
  Problem: Education: Goal: Knowledge of General Education information will improve Description: Including pain rating scale, medication(s)/side effects and non-pharmacologic comfort measures Outcome: Progressing   Problem: Health Behavior/Discharge Planning: Goal: Ability to manage health-related needs will improve Outcome: Progressing   Problem: Education: Goal: Knowledge of disease or condition will improve Outcome: Progressing   

## 2020-01-18 LAB — HEMOGLOBIN A1C
Hgb A1c MFr Bld: 5.6 % (ref 4.8–5.6)
Mean Plasma Glucose: 114 mg/dL

## 2020-01-19 ENCOUNTER — Encounter (HOSPITAL_COMMUNITY): Payer: Self-pay

## 2020-01-19 ENCOUNTER — Ambulatory Visit (HOSPITAL_COMMUNITY): Payer: Medicaid Other

## 2020-01-26 ENCOUNTER — Ambulatory Visit (HOSPITAL_COMMUNITY): Admission: RE | Admit: 2020-01-26 | Payer: Medicaid Other | Source: Ambulatory Visit

## 2020-05-13 ENCOUNTER — Other Ambulatory Visit (HOSPITAL_BASED_OUTPATIENT_CLINIC_OR_DEPARTMENT_OTHER): Payer: Self-pay

## 2020-05-20 ENCOUNTER — Other Ambulatory Visit (HOSPITAL_COMMUNITY)
Admission: RE | Admit: 2020-05-20 | Discharge: 2020-05-20 | Disposition: A | Discharge status: Home / Self Care | Payer: Medicaid Other | Source: Ambulatory Visit | Attending: Neurology | Admitting: Neurology

## 2020-05-20 ENCOUNTER — Other Ambulatory Visit: Payer: Self-pay

## 2020-05-20 DIAGNOSIS — Z20822 Contact with and (suspected) exposure to covid-19: Secondary | ICD-10-CM | POA: Insufficient documentation

## 2020-05-20 DIAGNOSIS — Z01812 Encounter for preprocedural laboratory examination: Secondary | ICD-10-CM | POA: Diagnosis not present

## 2020-05-21 LAB — SARS CORONAVIRUS 2 (TAT 6-24 HRS): SARS Coronavirus 2: NEGATIVE

## 2020-05-22 ENCOUNTER — Ambulatory Visit: Payer: Medicaid Other | Attending: Neurology | Admitting: Neurology

## 2020-05-22 ENCOUNTER — Other Ambulatory Visit: Payer: Self-pay

## 2020-05-22 DIAGNOSIS — Z7902 Long term (current) use of antithrombotics/antiplatelets: Secondary | ICD-10-CM | POA: Diagnosis not present

## 2020-05-22 DIAGNOSIS — G4733 Obstructive sleep apnea (adult) (pediatric): Secondary | ICD-10-CM

## 2020-05-22 DIAGNOSIS — Z79899 Other long term (current) drug therapy: Secondary | ICD-10-CM | POA: Insufficient documentation

## 2020-05-22 DIAGNOSIS — Z7982 Long term (current) use of aspirin: Secondary | ICD-10-CM | POA: Insufficient documentation

## 2020-05-23 NOTE — Procedures (Signed)
[facility] Moriah Loughry A. Gerilyn Pilgrim, MD     [email or web address]             NOCTURNAL POLYSOMNOGRAPHY   LOCATION: [facility]   Patient Name: Ranier, Coach Date: 05/22/2020 Gender: Male D.O.B: [birth year] Age (years): 70 Referring Provider: Beryle Beams MD, ABSM Height (inches): 70 Interpreting Physician: Beryle Beams MD, ABSM Weight (lbs): 201 RPSGT: Peak, Robert BMI: 29 MRN: [id number] Neck Size: 17.50 CLINICAL INFORMATION Sleep Study Type: NPSG     Indication for sleep study: N/A     Epworth Sleepiness Score: 9     SLEEP STUDY TECHNIQUE As per the AASM Manual for the Scoring of Sleep and Associated Events v2.3 (April 2016) with a hypopnea requiring 4% desaturations.  The channels recorded and monitored were frontal, central and occipital EEG, electrooculogram (EOG), submentalis EMG (chin), nasal and oral airflow, thoracic and abdominal wall motion, anterior tibialis EMG, snore microphone, electrocardiogram, and pulse oximetry.  MEDICATIONS Medications self-administered by patient taken the night of the study : N/A  Current Outpatient Medications:  .  acetaminophen (TYLENOL) 325 MG tablet, Take 2 tablets (650 mg total) by mouth every 4 (four) hours as needed for mild pain or headache (or temp > 37.5 C (99.5 F))., Disp: 12 tablet, Rfl: 0 .  albuterol (VENTOLIN HFA) 108 (90 Base) MCG/ACT inhaler, Inhale 2 puffs into the lungs every 6 (six) hours as needed., Disp: , Rfl:  .  ALPRAZolam (XANAX) 1 MG tablet, Take 1 mg by mouth 2 (two) times daily as needed for anxiety., Disp: , Rfl:  .  aspirin EC 81 MG tablet, Take 1 tablet (81 mg total) by mouth daily with breakfast. Take Aspirin 81 mg daily along with Plavix 75 mg daily for 30 days then after that STOP the aspirin and continue ONLY Plavix 75 mg daily indefinitely, Disp: 30 tablet, Rfl: 0 .  atorvastatin (LIPITOR) 40 MG tablet, Take 1 tablet (40 mg total) by mouth every evening. For stroke prevention,  Disp: 30 tablet, Rfl: 11 .  clopidogrel (PLAVIX) 75 MG tablet, Take 1 tablet (75 mg total) by mouth daily. Take Aspirin 81 mg daily along with Plavix 75 mg daily for 30 days then after that STOP the aspirin and continue ONLY Plavix 75 mg daily indefinitely--for stroke prevention, Disp: 30 tablet, Rfl: 11 .  diclofenac Sodium (VOLTAREN) 1 % GEL, Apply 1 application topically in the morning, at noon, in the evening, and at bedtime., Disp: , Rfl:  .  hydrOXYzine (ATARAX/VISTARIL) 25 MG tablet, Take 25 mg by mouth 2 (two) times daily as needed., Disp: , Rfl:  .  omeprazole (PRILOSEC) 20 MG capsule, Take 20 mg by mouth daily., Disp: , Rfl:  .  SUBOXONE 4-1 MG FILM, Place 1 strip under the tongue 2 (two) times daily., Disp: , Rfl:      SLEEP ARCHITECTURE The study was initiated at 9:35:09 PM and ended at 5:00:51 AM.  Sleep onset time was 4.8 minutes and the sleep efficiency was 62.2%%. The total sleep time was 277.4 minutes.  Stage REM latency was 226.0 minutes.  The patient spent 15.3%% of the night in stage N1 sleep, 68.3%% in stage N2 sleep, 0.0%% in stage N3 and 16.4% in REM.  Alpha intrusion was absent.  Supine sleep was 29.75%.  RESPIRATORY PARAMETERS The overall apnea/hypopnea index (AHI) was 36.6 per hour. There were 72 total apneas, including 0 obstructive, 72 central and 0 mixed apneas. There were 97 hypopneas and 0 RERAs.  The AHI during Stage REM  sleep was 11.9 per hour.  AHI while supine was 90.9 per hour.  The mean oxygen saturation was 90.4%. The minimum SpO2 during sleep was 83.0%.  loud snoring was noted during this study.  CARDIAC DATA The 2 lead EKG demonstrated sinus rhythm. The mean heart rate was 69.5 beats per minute. Other EKG findings include: None.  LEG MOVEMENT DATA The total PLMS were 0 with a resulting PLMS index of 0.0. Associated arousal with leg movement index was 0.0.  IMPRESSIONS 1. Moderate to severe obstructive sleep apnea syndrome is documented  with this recording. AutoPAP 8-14 is recommended.  2. Abnormal sleep architecture is also noted with increased fragmentation of sleep, reduced sleep efficiency and absent slow-wave sleep.      Delano Metz, MD Diplomate, American Board of Sleep Medicine.  ELECTRONICALLY SIGNED ON:  05/23/2020, 3:10 PM [place] PH: [phone]   FX: [phone] [place]

## 2021-02-07 ENCOUNTER — Ambulatory Visit (HOSPITAL_COMMUNITY)
Admission: RE | Admit: 2021-02-07 | Discharge: 2021-02-07 | Disposition: A | Discharge status: Home / Self Care | Payer: Medicaid Other | Source: Ambulatory Visit | Attending: Internal Medicine | Admitting: Internal Medicine

## 2021-02-07 ENCOUNTER — Other Ambulatory Visit (HOSPITAL_COMMUNITY): Payer: Self-pay | Admitting: Internal Medicine

## 2021-02-07 ENCOUNTER — Other Ambulatory Visit: Payer: Self-pay

## 2021-02-07 DIAGNOSIS — M545 Low back pain, unspecified: Secondary | ICD-10-CM

## 2021-02-07 IMAGING — DX DG LUMBAR SPINE 2-3V
3 series · 3 of 3 positions shown · non-contrast
Comparison: [DATE]

CLINICAL DATA: 59-year-old male with chronic low back pain.

EXAM:
LUMBAR SPINE - 2-3 VIEW

[l-spine ap]
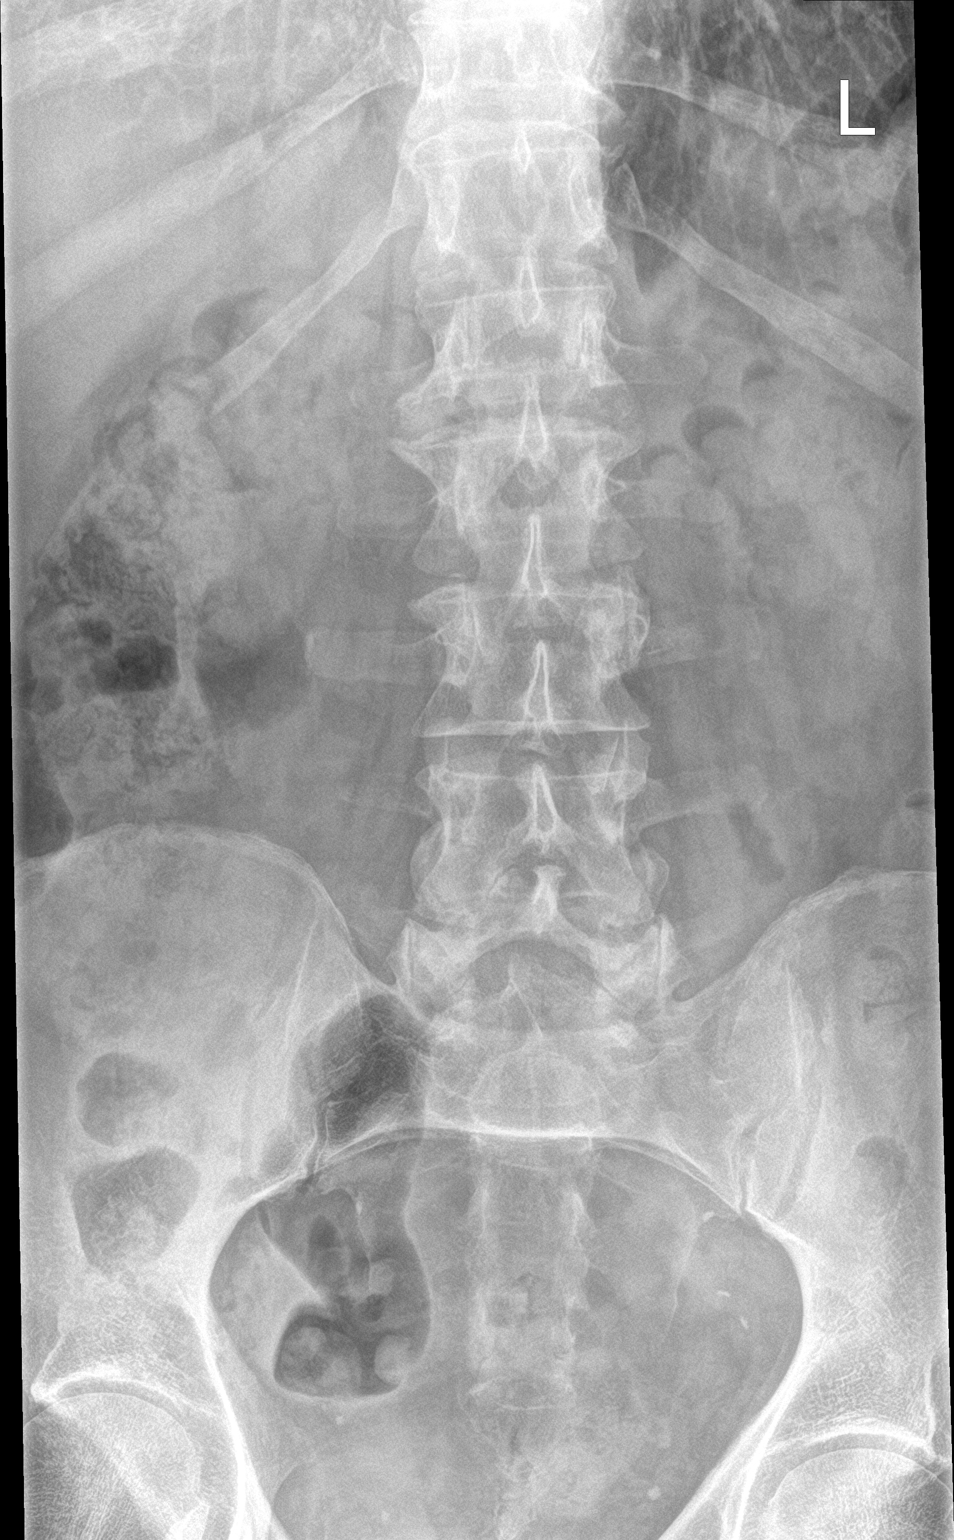

[l-spine lat]
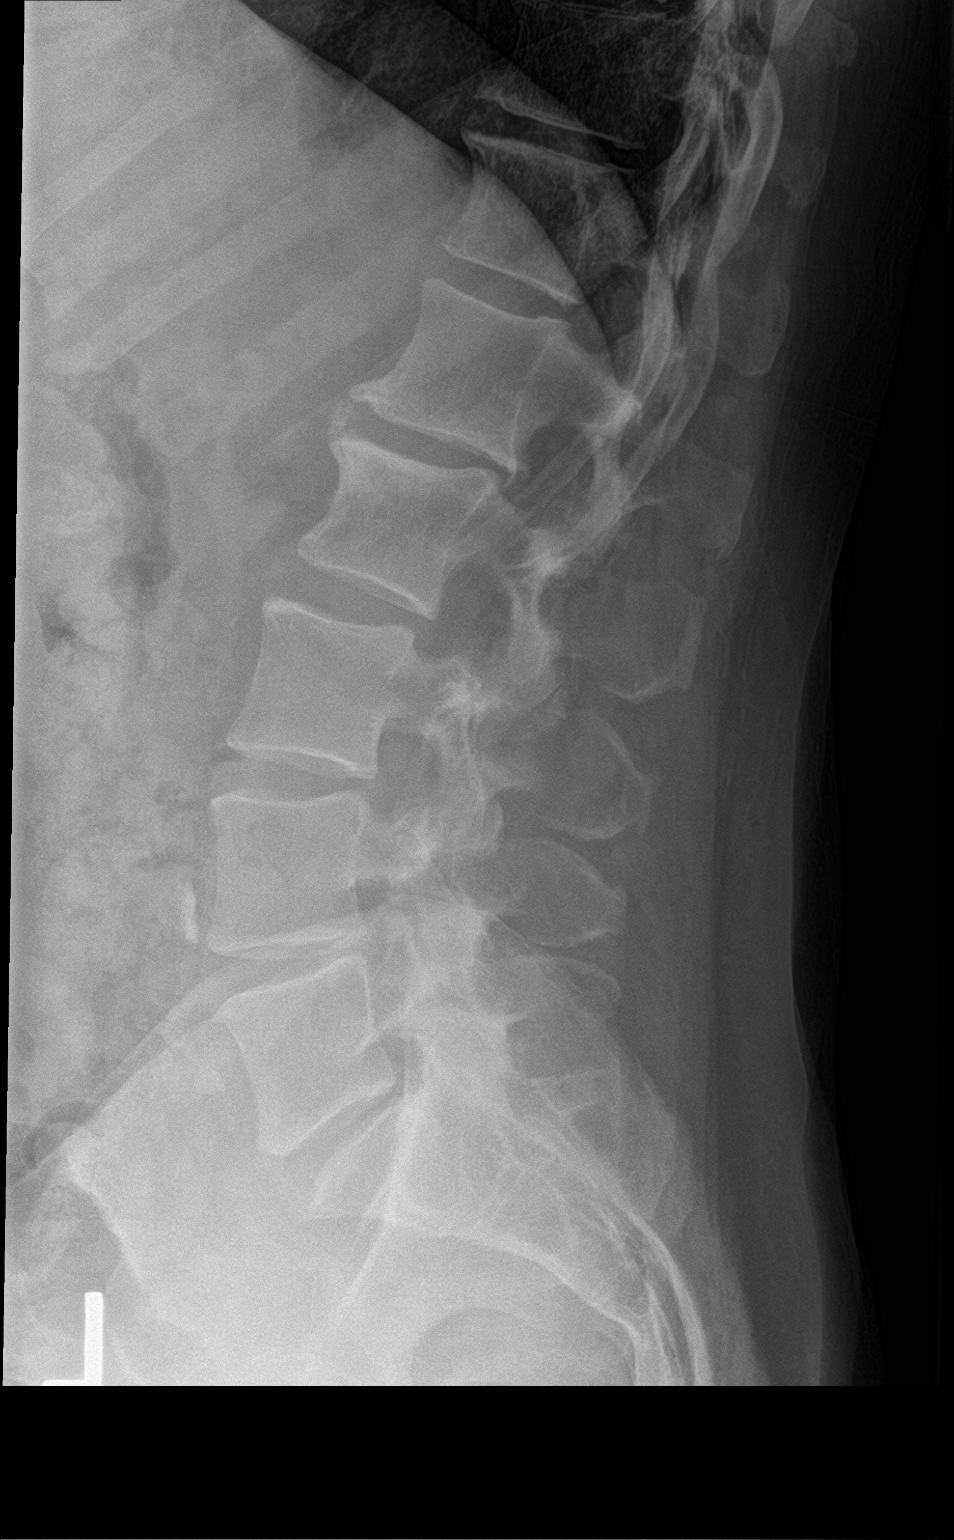

[l-spine spot]
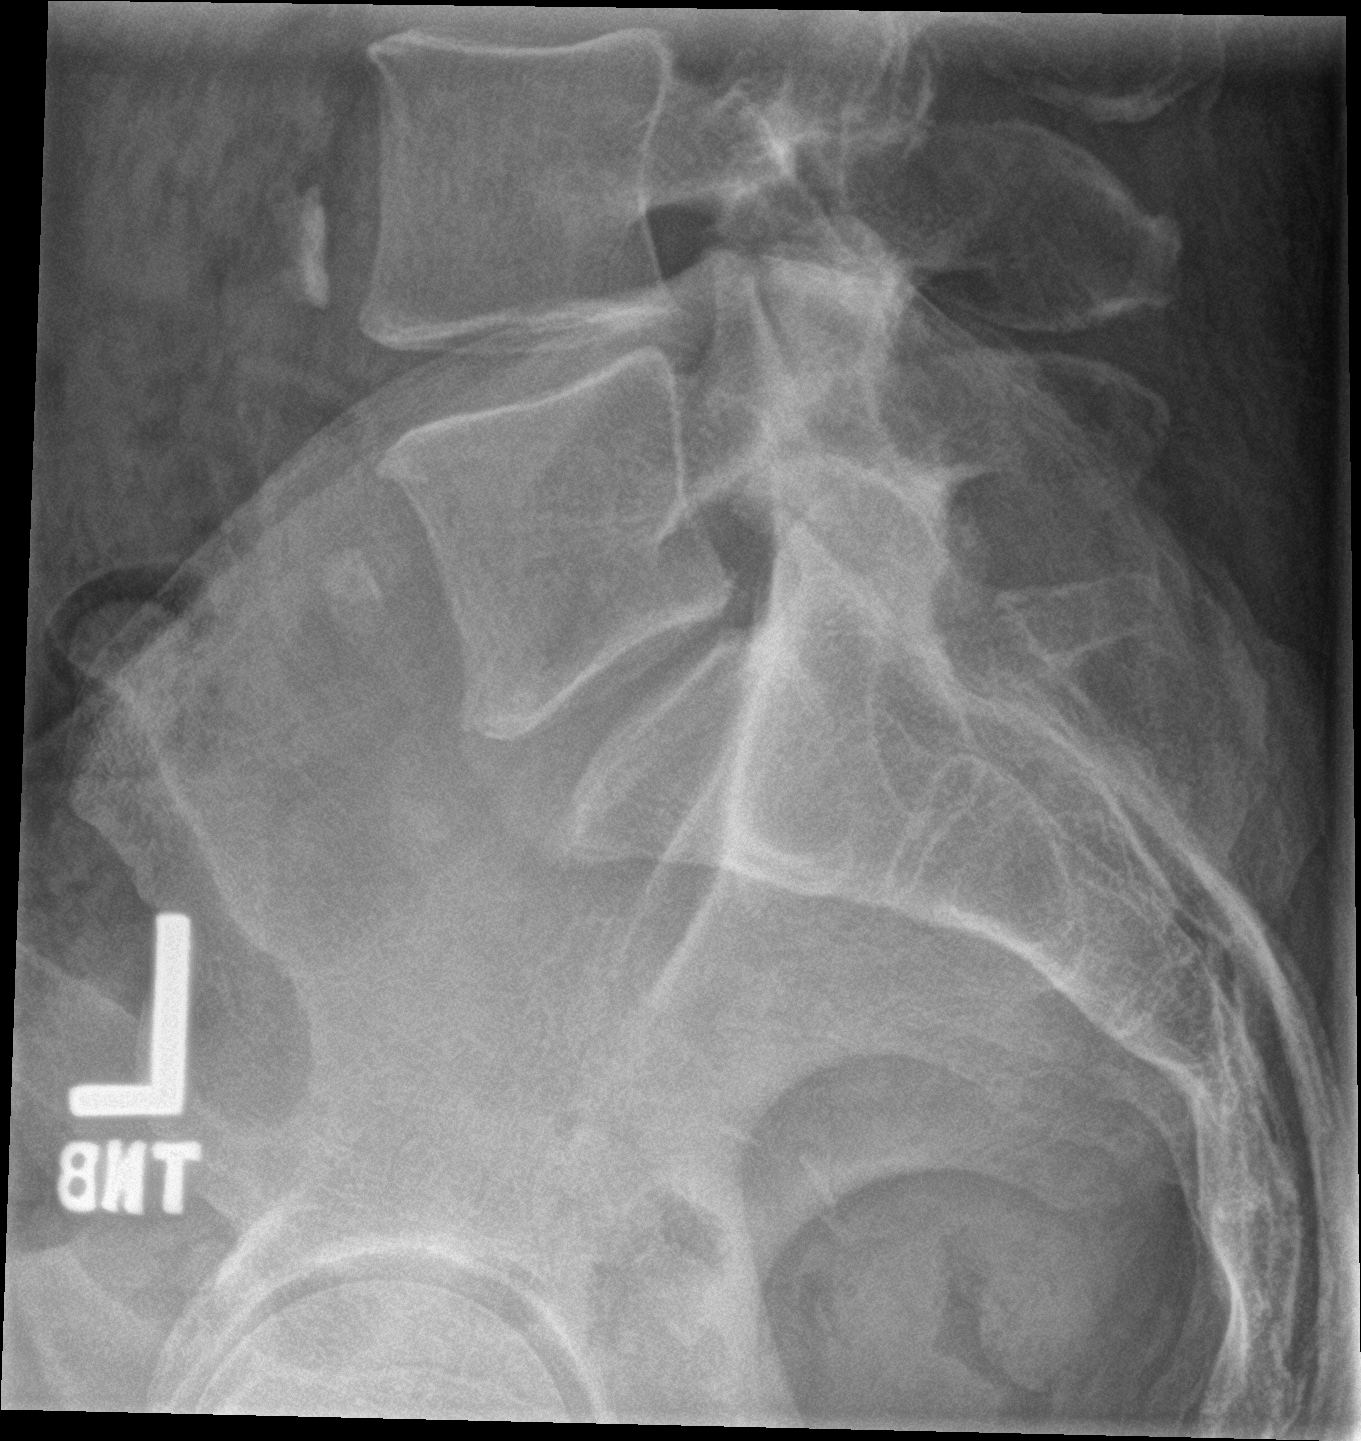

[3 of 3 positions shown; findings below may reference images not displayed]

FINDINGS: No acute fracture or subluxation.

Multilevel degenerative disc disease/spondylosis noted, mild to
moderate at L1-2 and mild at L4-5 and L5-S1.

Mild facet arthropathy in the LOWER lumbar spine is again noted.

No focal bony lesions are noted.
IMPRESSION: No evidence of acute abnormality.

Unchanged multilevel degenerative disc disease/spondylosis and facet
arthropathy as described.

## 2021-03-24 ENCOUNTER — Other Ambulatory Visit: Payer: Self-pay

## 2021-03-24 ENCOUNTER — Ambulatory Visit (HOSPITAL_COMMUNITY): Payer: Medicaid Other | Admitting: Clinical

## 2021-03-24 ENCOUNTER — Telehealth (HOSPITAL_COMMUNITY): Payer: Self-pay | Admitting: *Deleted

## 2021-03-24 NOTE — Telephone Encounter (Signed)
Patient called at 2:24pm on 03-24-2021 stating that he did not get the call for him to do his appointment. Staff informed patient that per provider he tried reaching out to him and there may be a charge for no show. Patient then became irritated stating he should not be getting changed because he received a link and he was trying to get in and it was not 2pm it was shortly after 2:00pm. Staff asked him to clarify what is shortly after 2pm and patient stated it was 2:03 that he got the call. I then asked patient if he wanted to schedule another appt. Patient then stated that he's going to call his insurance company because he should not have to pay the no show fee. Staff then informed patient that provider did try to reach out to him and patient stated provider did not reach out to him and it was not the scheduled time that was told to him. Patient then started aggressively cursing at staff and shortly after the curse words towards staff, staff tried to talk patient down and patient was not trying to hear staff and kept yelling at staff although staff was trying to resch pt. Pt kept yelling at staff and staff asked patient to please stop cursing due to staff trying to help him and patient kept on and staff advise patient that due to the cursing she would be hanging up and staff repeated it 3 times and pt kept on so staff had to hang up.

## 2021-05-26 ENCOUNTER — Encounter (HOSPITAL_COMMUNITY): Payer: Self-pay | Admitting: Emergency Medicine

## 2021-05-26 ENCOUNTER — Emergency Department (HOSPITAL_COMMUNITY)
Admission: EM | Admit: 2021-05-26 | Discharge: 2021-05-27 | Disposition: A | Discharge status: Home / Self Care | Payer: Medicaid Other | Attending: Emergency Medicine | Admitting: Emergency Medicine

## 2021-05-26 ENCOUNTER — Other Ambulatory Visit: Payer: Self-pay

## 2021-05-26 ENCOUNTER — Emergency Department (HOSPITAL_COMMUNITY): Payer: Medicaid Other

## 2021-05-26 DIAGNOSIS — E860 Dehydration: Secondary | ICD-10-CM

## 2021-05-26 DIAGNOSIS — N179 Acute kidney failure, unspecified: Secondary | ICD-10-CM | POA: Diagnosis not present

## 2021-05-26 DIAGNOSIS — J449 Chronic obstructive pulmonary disease, unspecified: Secondary | ICD-10-CM | POA: Diagnosis not present

## 2021-05-26 DIAGNOSIS — Z8673 Personal history of transient ischemic attack (TIA), and cerebral infarction without residual deficits: Secondary | ICD-10-CM | POA: Insufficient documentation

## 2021-05-26 DIAGNOSIS — F17210 Nicotine dependence, cigarettes, uncomplicated: Secondary | ICD-10-CM | POA: Insufficient documentation

## 2021-05-26 DIAGNOSIS — R2689 Other abnormalities of gait and mobility: Secondary | ICD-10-CM | POA: Diagnosis present

## 2021-05-26 DIAGNOSIS — R42 Dizziness and giddiness: Secondary | ICD-10-CM

## 2021-05-26 HISTORY — DX: Cerebral infarction, unspecified: I63.9

## 2021-05-26 LAB — PROTIME-INR
INR: 1.1 (ref 0.8–1.2)
Prothrombin Time: 13.9 seconds (ref 11.4–15.2)

## 2021-05-26 LAB — COMPREHENSIVE METABOLIC PANEL
ALT: 30 U/L (ref 0–44)
AST: 26 U/L (ref 15–41)
Albumin: 4 g/dL (ref 3.5–5.0)
Alkaline Phosphatase: 68 U/L (ref 38–126)
Anion gap: 8 (ref 5–15)
BUN: 11 mg/dL (ref 6–20)
CO2: 25 mmol/L (ref 22–32)
Calcium: 8.7 mg/dL — ABNORMAL LOW (ref 8.9–10.3)
Chloride: 103 mmol/L (ref 98–111)
Creatinine, Ser: 1.69 mg/dL — ABNORMAL HIGH (ref 0.61–1.24)
GFR, Estimated: 46 mL/min — ABNORMAL LOW (ref >60)
Glucose, Bld: 141 mg/dL — ABNORMAL HIGH (ref 70–99)
Potassium: 3.8 mmol/L (ref 3.5–5.1)
Sodium: 136 mmol/L (ref 135–145)
Total Bilirubin: 0.7 mg/dL (ref 0.3–1.2)
Total Protein: 7.4 g/dL (ref 6.5–8.1)

## 2021-05-26 LAB — DIFFERENTIAL
Abs Immature Granulocytes: 0.1 10*3/uL — ABNORMAL HIGH (ref 0.00–0.07)
Basophils Absolute: 0.1 10*3/uL (ref 0.0–0.1)
Basophils Relative: 1 %
Eosinophils Absolute: 0.1 10*3/uL (ref 0.0–0.5)
Eosinophils Relative: 1 %
Immature Granulocytes: 1 %
Lymphocytes Relative: 17 %
Lymphs Abs: 2.4 10*3/uL (ref 0.7–4.0)
Monocytes Absolute: 0.6 10*3/uL (ref 0.1–1.0)
Monocytes Relative: 4 %
Neutro Abs: 11.3 10*3/uL — ABNORMAL HIGH (ref 1.7–7.7)
Neutrophils Relative %: 76 %

## 2021-05-26 LAB — CBC
HCT: 46.2 % (ref 39.0–52.0)
Hemoglobin: 15.3 g/dL (ref 13.0–17.0)
MCH: 31.7 pg (ref 26.0–34.0)
MCHC: 33.1 g/dL (ref 30.0–36.0)
MCV: 95.9 fL (ref 80.0–100.0)
Platelets: 315 10*3/uL (ref 150–400)
RBC: 4.82 MIL/uL (ref 4.22–5.81)
RDW: 13.2 % (ref 11.5–15.5)
WBC: 14.6 10*3/uL — ABNORMAL HIGH (ref 4.0–10.5)
nRBC: 0 % (ref 0.0–0.2)

## 2021-05-26 LAB — APTT: aPTT: 23 seconds — ABNORMAL LOW (ref 24–36)

## 2021-05-26 LAB — CBG MONITORING, ED: Glucose-Capillary: 156 mg/dL — ABNORMAL HIGH (ref 70–99)

## 2021-05-26 IMAGING — CT CT HEAD W/O CM
3 series · 15 of 46 positions shown, 18 images · non-contrast
Comparison: MRI [DATE], CT brain [DATE]

CLINICAL DATA: Dizziness

EXAM:
CT HEAD WITHOUT CONTRAST
TECHNIQUE: Contiguous axial images were obtained from the base of the skull
through the vertex without intravenous contrast.

[Series 2: head w o · axial · 0.44mm/px · z∈[+191,+311]mm · 9 of 29 slices shown, 12 images]
[im 3/29  brain]
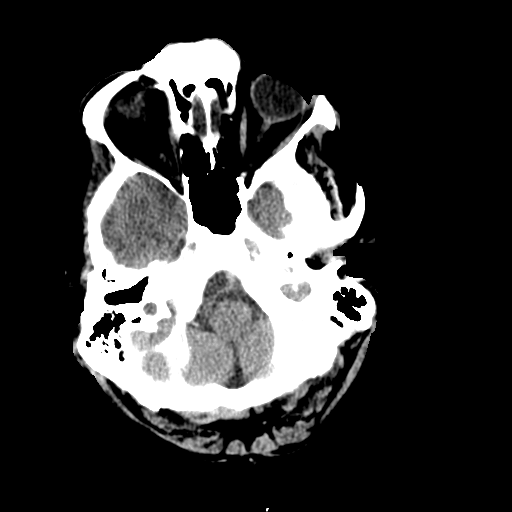
[im 3/29  bone]
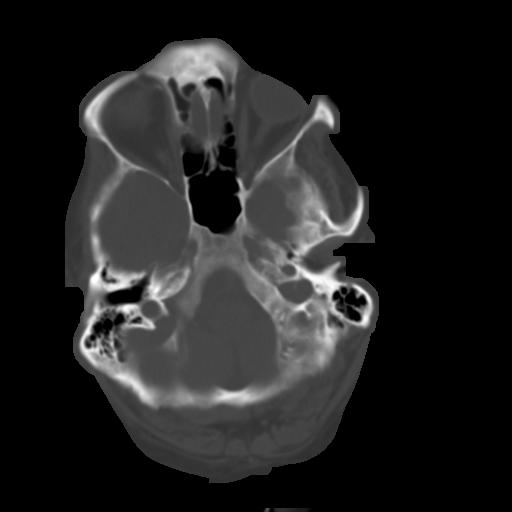
[im 6/29  brain]
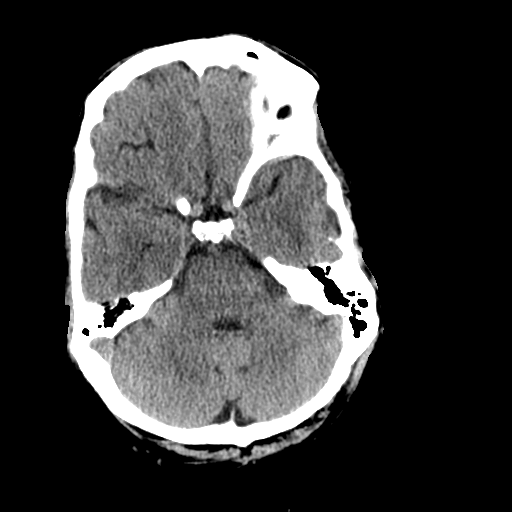
[im 9/29  brain]
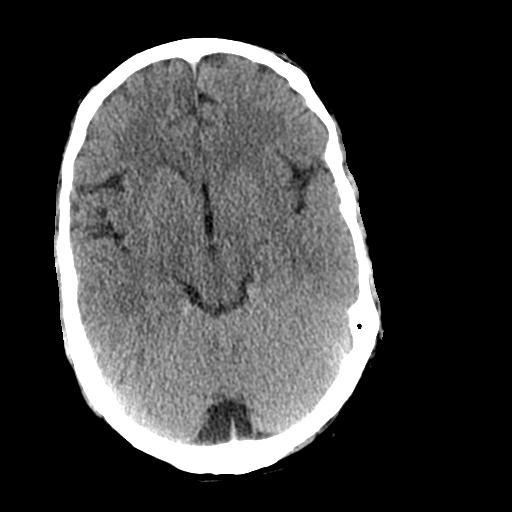
[im 12/29  brain]
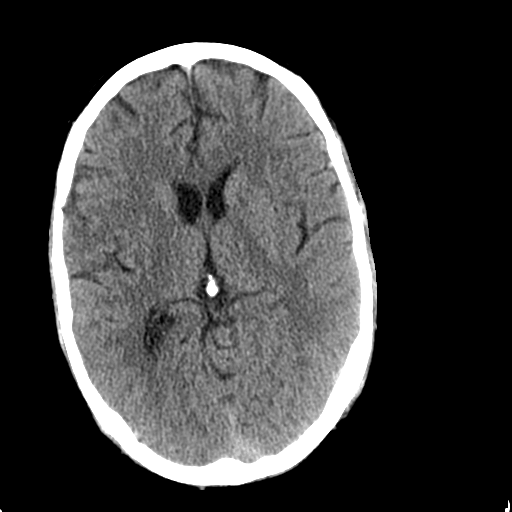
[im 15/29  brain]
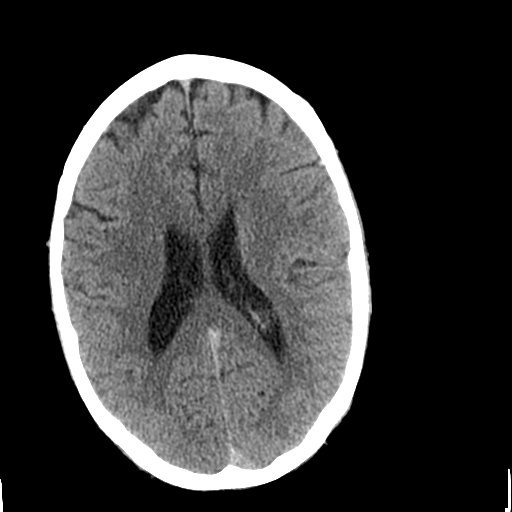
[im 15/29  bone]
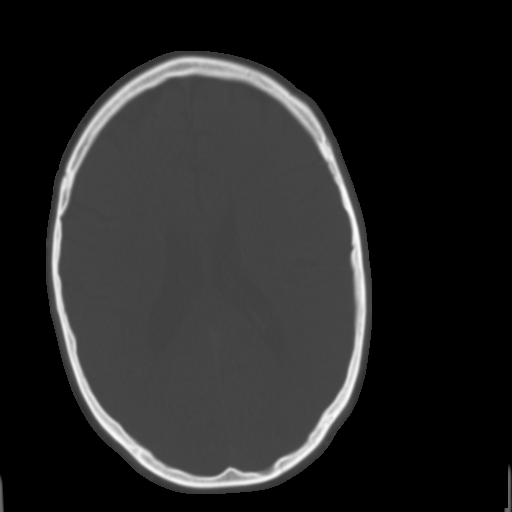
[im 18/29  brain]
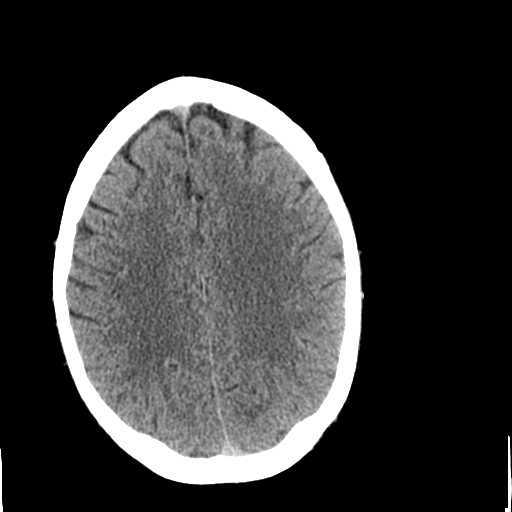
[im 21/29  brain]
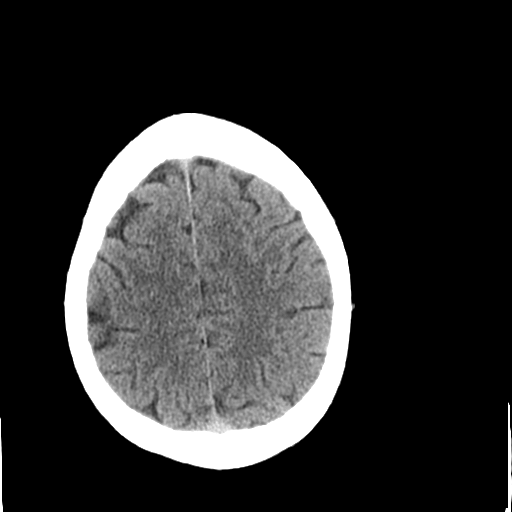
[im 24/29  brain]
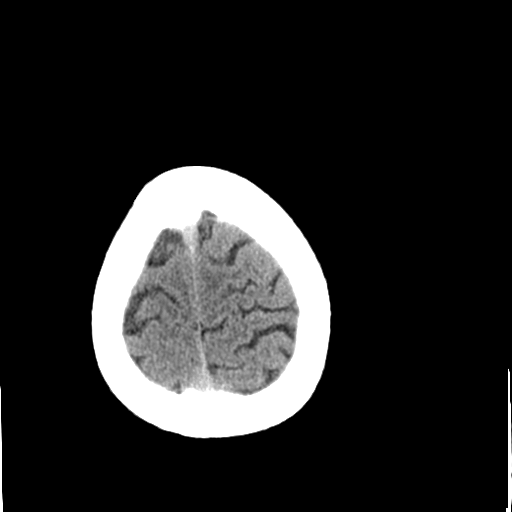
[im 27/29  brain]
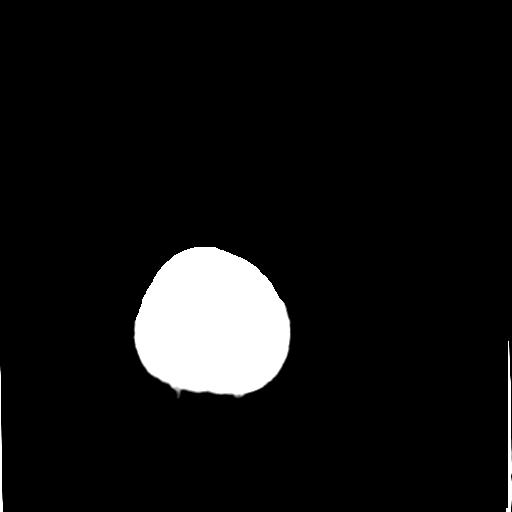
[im 27/29  bone]
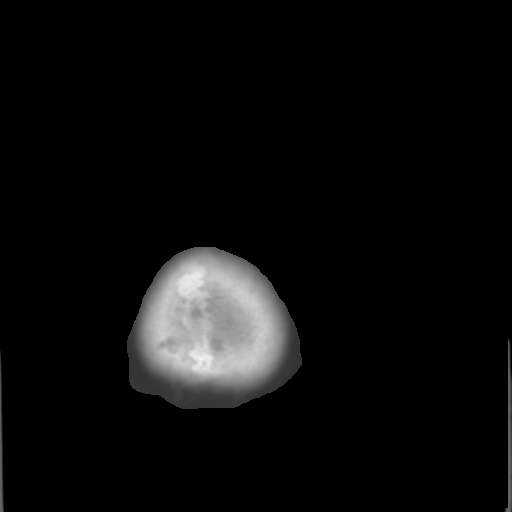

[Series 4: coronal soft · coronal · 0.31mm/px · 3 of 69 slices shown]
[im 23/69  brain]
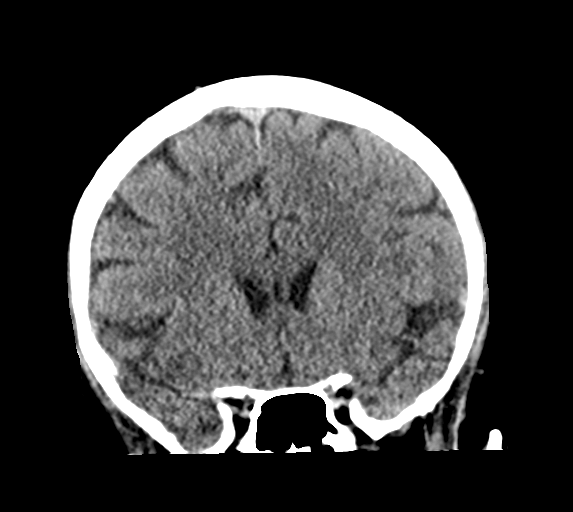
[im 31/69  brain]
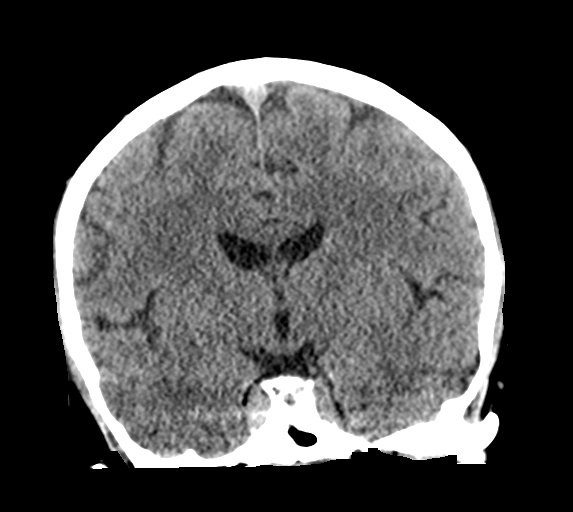
[im 38/69  brain]
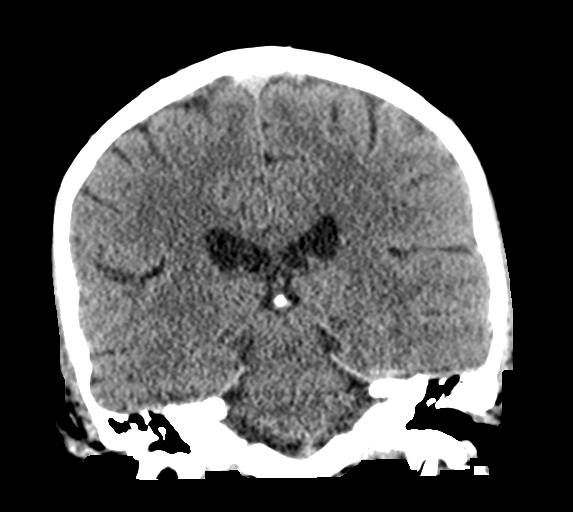

[Series 5: sagittal soft · sagittal · 0.30mm/px · 3 of 51 slices shown]
[im 17/51  brain]
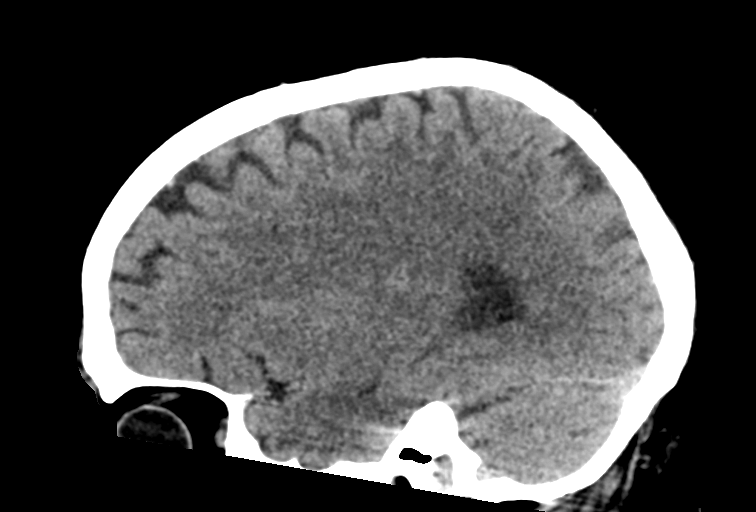
[im 26/51  brain]
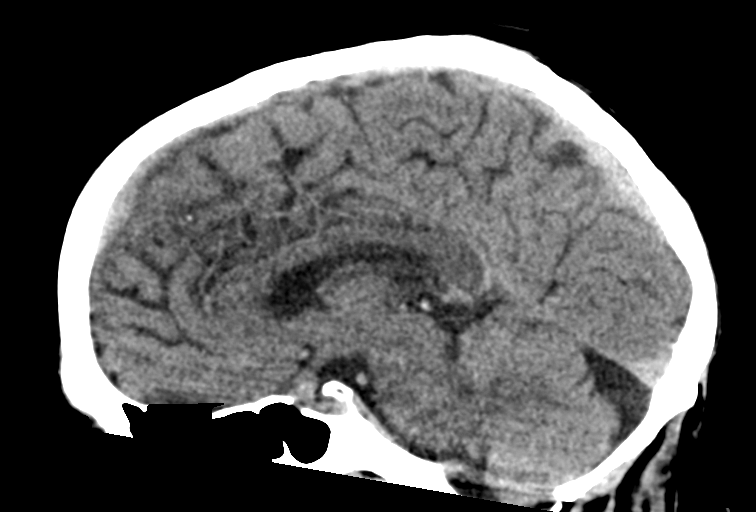
[im 34/51  brain]
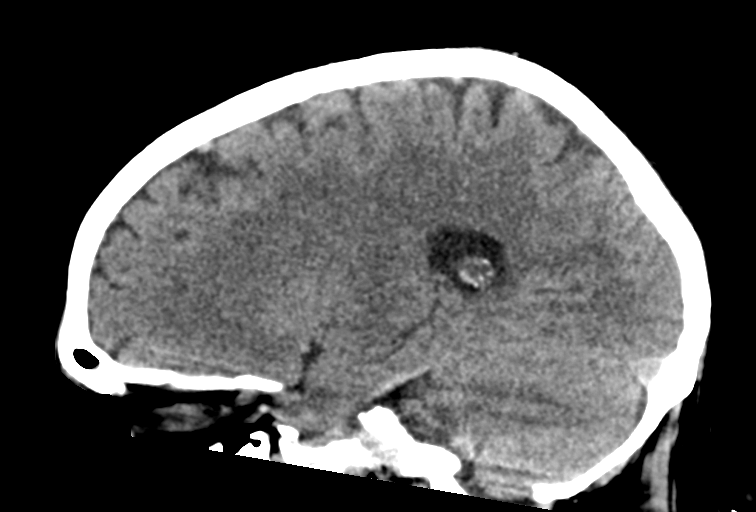

[15 of 46 positions shown; findings below may reference images not displayed]

FINDINGS: Brain: No acute territorial infarction, hemorrhage or intracranial
mass. The ventricles are nonenlarged.

Vascular: No hyperdense vessels.  No unexpected calcification.

Skull: Normal. Negative for fracture or focal lesion. Right
mandibular head is not visualized within the mandibular fossa and is
probably subluxed anteriorly.

Sinuses/Orbits: No acute finding. Sinus mucosal disease within the
ethmoid and left frontal sinus

Other: None
IMPRESSION: No CT evidence for acute intracranial abnormality.

## 2021-05-26 MED ORDER — SODIUM CHLORIDE 0.9 % IV BOLUS
1000.0000 mL | Freq: Once | INTRAVENOUS | Status: AC
  Administered 2021-05-27: 1000 mL via INTRAVENOUS

## 2021-05-26 NOTE — ED Triage Notes (Addendum)
Pt states he is here because his family thinks he may have had a mini-stroke. He states that they said his speech was "off" and that he had some L sided facial drooping (eye). Pt states he feels fine. Pt is alert and oriented x 4. Pt states he did ask for his granddaughter when he first woke up and that she wasn't at the house. Pt with CVA x 1 yr ago and states that he still has numbness and tingling on his right side. LKW was 6am.

## 2021-05-26 NOTE — ED Provider Notes (Addendum)
[facility]-EMERGENCY DEPT [facility] Emergency Department Provider Note MRN:  [id number]  Arrival date & time: 05/27/21     Chief Complaint   Gait Problem   History of Present Illness   Justin Price is a 60 y.o. year-old male with a history of stroke, COPD presenting to the ED with chief complaint of gait problem.  Patient felt normal yesterday has a history of stroke and occasionally has some right arm and right leg numbness/weakness/pain.  This is his baseline this morning he woke up and noticed he was consistently losing his balance to the left.  Family also noted that his speech was more slurred than normal.  Denies any new numbness or weakness to the arms or legs, no trouble swallowing, no headache, no vision change, no chest pain or shortness of breath, no abdominal pain.  Feels a bit lightheaded, has not been eating or drinking much over the past few days.  Review of Systems  A complete 10 system review of systems was obtained and all systems are negative except as noted in the HPI and PMH.   Patient's Health History    Past Medical History:  Diagnosis Date   COPD (chronic obstructive pulmonary disease) (HCC)    Stroke [facility])     Past Surgical History:  Procedure Laterality Date   ORTHOPEDIC SURGERY     vascetomy      History reviewed. No pertinent family history.  Social History   Socioeconomic History   Marital status: Divorced    Spouse name: Not on file   Number of children: Not on file   Years of education: Not on file   Highest education level: Not on file  Occupational History   Not on file  Tobacco Use   Smoking status: Every Day    Packs/day: 2.00    Pack years: 0.00    Types: Cigarettes   Smokeless tobacco: Never  Vaping Use   Vaping Use: Never used  Substance and Sexual Activity   Alcohol use: Yes    Alcohol/week: 5.0 standard drinks    Types: 5 Cans of beer per week   Drug use: Not on file   Sexual activity: Yes  Other Topics Concern   Not  on file  Social History Narrative   Not on file   Social Determinants of Health   Financial Resource Strain: Not on file  Food Insecurity: Not on file  Transportation Needs: Not on file  Physical Activity: Not on file  Stress: Not on file  Social Connections: Not on file  Intimate Partner Violence: Not on file     Physical Exam   Vitals:   05/27/21 0600 05/27/21 0630  BP: (!) 137/92 (!) 142/98  Pulse: 77 77  Resp: 16 16  Temp:    SpO2: 92% 97%    CONSTITUTIONAL: Well-appearing, NAD NEURO:  Alert and oriented x 3, normal and symmetric strength and sensation, normal coordination, normal speech EYES:  eyes equal and reactive ENT/NECK:  no LAD, no JVD CARDIO: Regular rate, well-perfused, normal S1 and S2 PULM:  CTAB no wheezing or rhonchi GI/GU:  normal bowel sounds, non-distended, non-tender MSK/SPINE:  No gross deformities, no edema SKIN:  no rash, atraumatic PSYCH:  Appropriate speech and behavior  *Additional and/or pertinent findings included in MDM below  Diagnostic and Interventional Summary    EKG Interpretation  Date/Time:  Monday May 26 2021 21:57:48 EDT Ventricular Rate:  133 PR Interval:  152 QRS Duration: 66 QT Interval:  282 QTC  Calculation: 419 R Axis:   61 Text Interpretation: Sinus tachycardia Cannot rule out Anterior infarct , age undetermined Abnormal ECG Confirmed by Kennis Carina [phone]) on 05/26/2021 11:15:45 PM        Labs Reviewed  APTT - Abnormal; Notable for the following components:      Result Value   aPTT 23 (*)    All other components within normal limits  CBC - Abnormal; Notable for the following components:   WBC 14.6 (*)    All other components within normal limits  DIFFERENTIAL - Abnormal; Notable for the following components:   Neutro Abs 11.3 (*)    Abs Immature Granulocytes 0.10 (*)    All other components within normal limits  COMPREHENSIVE METABOLIC PANEL - Abnormal; Notable for the following components:   Glucose,  Bld 141 (*)    Creatinine, Ser 1.69 (*)    Calcium 8.7 (*)    GFR, Estimated 46 (*)    All other components within normal limits  CBG MONITORING, ED - Abnormal; Notable for the following components:   Glucose-Capillary 156 (*)    All other components within normal limits  PROTIME-INR    CT HEAD WO CONTRAST  Final Result    MR BRAIN WO CONTRAST    (Results Pending)    Medications  sodium chloride 0.9 % bolus 1,000 mL (0 mLs Intravenous Stopped 05/27/21 0110)     Procedures  /  Critical Care Procedures  ED Course and Medical Decision Making  I have reviewed the triage vital signs, the nursing notes, and pertinent available records from the EMR.  Listed above are laboratory and imaging tests that I personally ordered, reviewed, and interpreted and then considered in my medical decision making (see below for details).  Considering acute ischemic stroke, also considering that the symptoms could be due to dehydration given patient's laboratory assessment.  Patient is tachycardic with an AKI.  Plan is to provide fluids and reassess, patient will likely need MRI to exclude stroke     Awaiting MRI, if negative and ambulating without issue patient would be appropriate for discharge.  Tachycardia has resolved with IV fluids.    MRI is normal.  On my reassessment patient is feeling well, normal speech, I personally ambulated the patient and he has no dizziness, no ataxia.  Appropriate for discharge with close follow-up.  Strict return precautions.  Elmer Sow. Pilar Plate, MD [facility] Emergency Medicine [facility] [email or web address]  Final Clinical Impressions(s) / ED Diagnoses     ICD-10-CM   1. Balance problem  R26.89     2. Dizziness  R42     3. Dehydration  E86.0       ED Discharge Orders     None        Discharge Instructions Discussed with and Provided to Patient:   Discharge Instructions   None       Sabas Sous, MD 05/27/21 4536    Sabas Sous, MD 05/27/21 [phone]

## 2021-05-27 ENCOUNTER — Emergency Department (HOSPITAL_COMMUNITY): Payer: Medicaid Other

## 2021-05-27 IMAGING — MR MR HEAD W/O CM
11 of 12 series · 40 of 48 positions shown · non-contrast
Comparison: [DATE]

CLINICAL DATA: Left-sided facial droop.  History of stroke

EXAM:
MRI HEAD WITHOUT CONTRAST
TECHNIQUE: Multiplanar, multiecho pulse sequences of the brain and surrounding
structures were obtained without intravenous contrast.

[Series 5: DWI · axial · 4.0mm · 0.88mm/px · z∈[-74,+64]mm · 4 of 36 slices shown (1 of 6)]
[im 1/36]
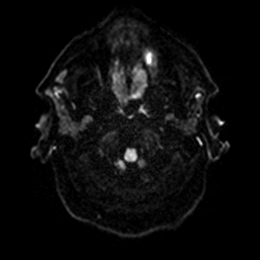
[im 12/36]
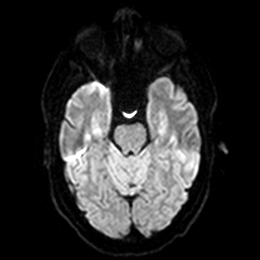
[im 24/36]
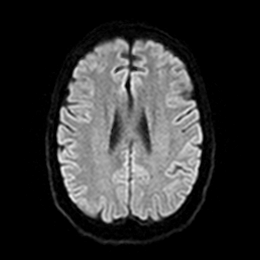
[im 36/36]
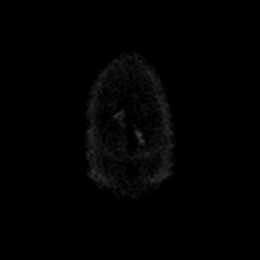

[Series 5: DWI · axial · 4.0mm · 0.88mm/px · z∈[-74,+64]mm · 4 of 36 slices shown (2 of 6)]
[im 1/36]
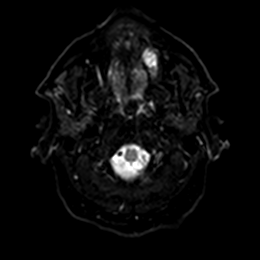
[im 12/36]
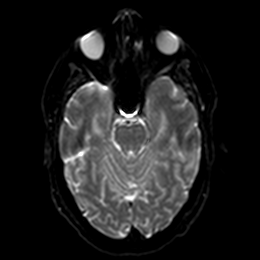
[im 24/36]
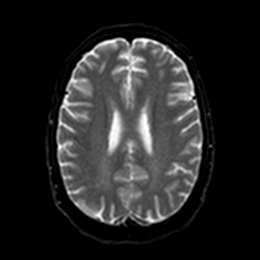
[im 36/36]
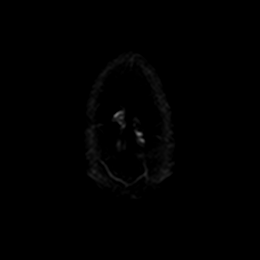

[Series 6: DWI · axial · 4.0mm · 0.88mm/px · z∈[-74,+64]mm · 4 of 36 slices shown (3 of 6)]
[im 1/36]
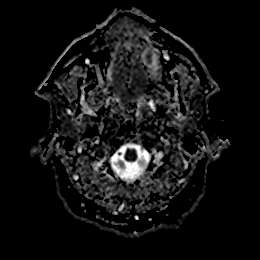
[im 12/36]
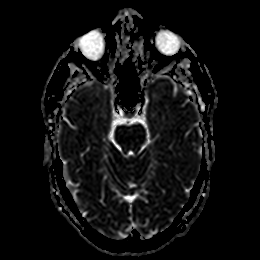
[im 24/36]
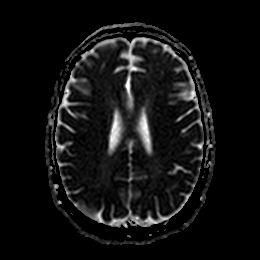
[im 36/36]
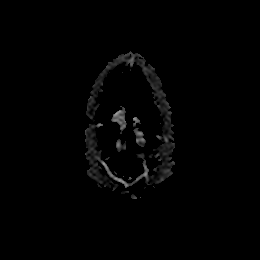

[Series 7: DWI · coronal · 5.0mm · 0.88mm/px · 3 of 28 slices shown (4 of 6)]
[im 1/28]
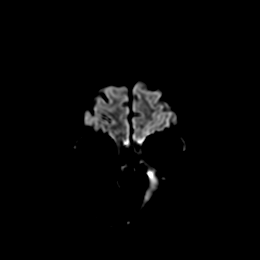
[im 14/28]
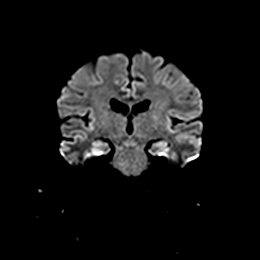
[im 28/28]
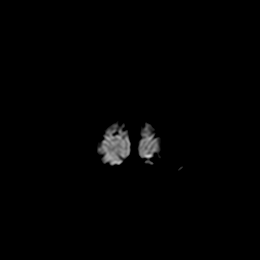

[Series 7: DWI · coronal · 5.0mm · 0.88mm/px · 4 of 28 slices shown (5 of 6)]
[im 1/28]
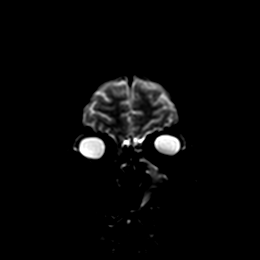
[im 10/28]
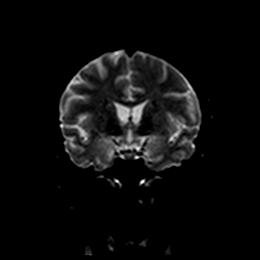
[im 19/28]
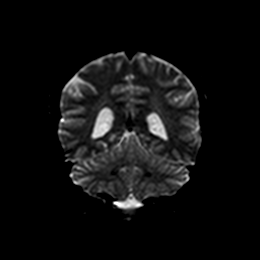
[im 28/28]
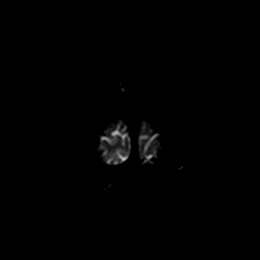

[Series 8: DWI · coronal · 5.0mm · 0.88mm/px · 4 of 28 slices shown (6 of 6)]
[im 1/28]
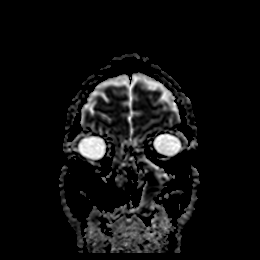
[im 10/28]
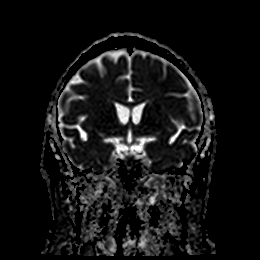
[im 19/28]
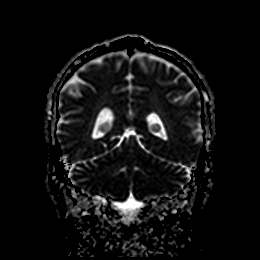
[im 28/28]
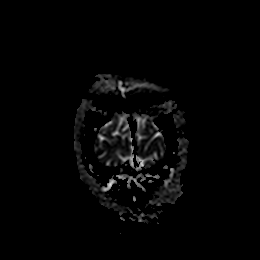

[Series 9: T1 · sagittal · 5.0mm · 0.94mm/px · 3 of 25 slices shown]
[im 1/25]
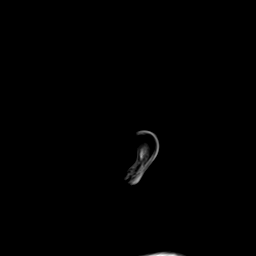
[im 13/25]
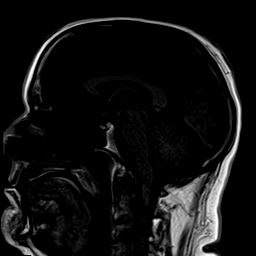
[im 25/25]
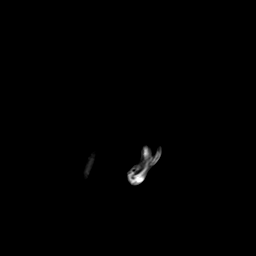

[Series 10: T2 · axial · 5.0mm · 0.72mm/px · z∈[-71,+61]mm · 3 of 20 slices shown (1 of 2)]
[im 1/20]
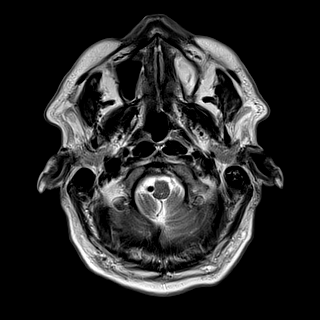
[im 10/20]
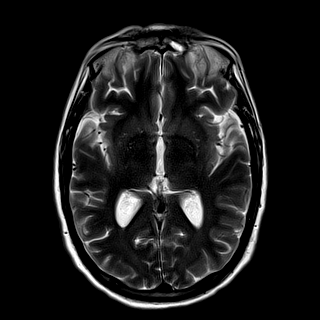
[im 20/20]
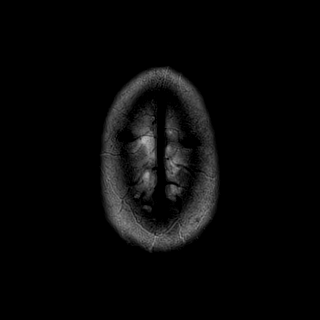

[Series 11: ax hemo · axial · 5.0mm · 0.86mm/px · z∈[-75,+67]mm · 3 of 25 slices shown]
[im 1/25]
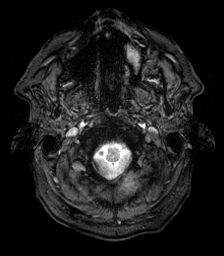
[im 13/25]
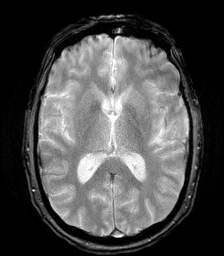
[im 25/25]
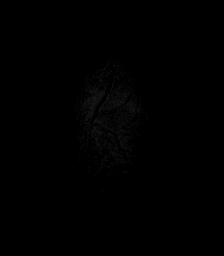

[Series 12: FLAIR · axial · 4.0mm · 0.43mm/px · z∈[-66,+57]mm · 4 of 32 slices shown]
[im 1/32]
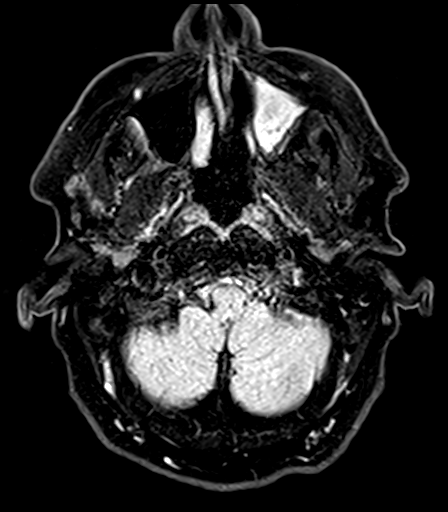
[im 11/32]
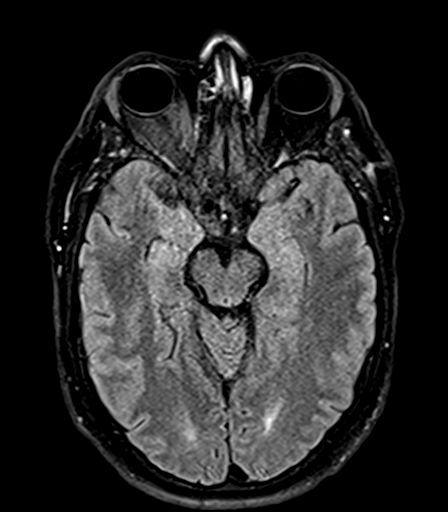
[im 21/32]
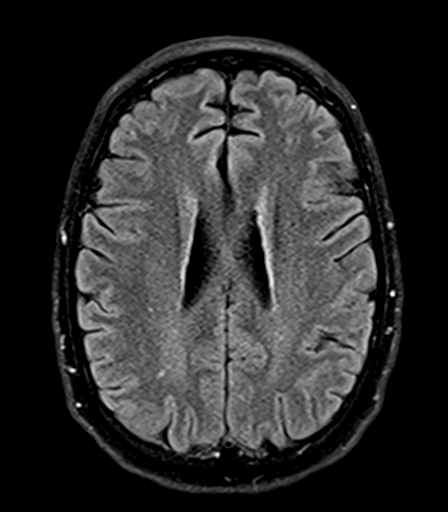
[im 32/32]
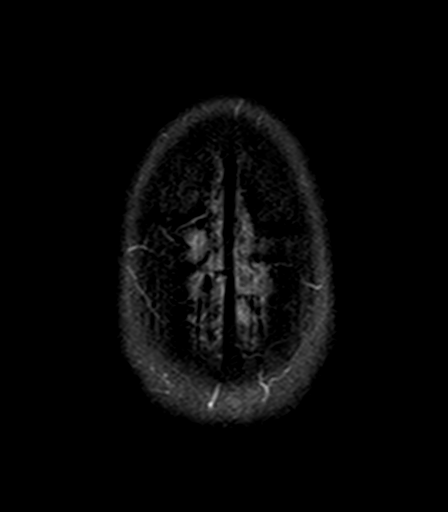

[Series 14: T2 · coronal · 5.0mm · 0.72mm/px · 4 of 28 slices shown (2 of 2)]
[im 1/28]
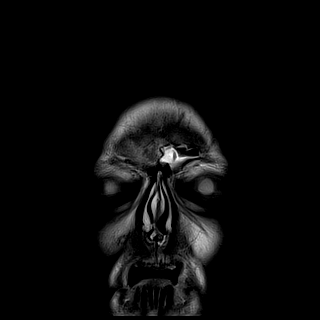
[im 10/28]
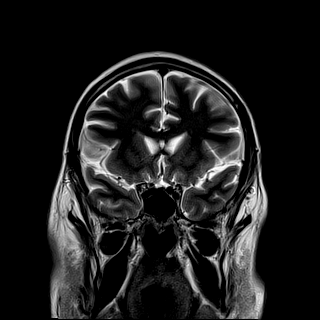
[im 19/28]
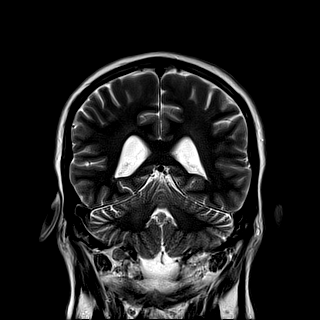
[im 28/28]
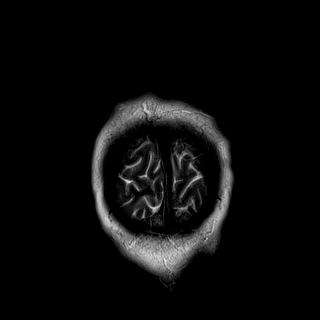

[40 of 48 positions shown; findings below may reference images not displayed]

FINDINGS: Brain: No acute infarction, hemorrhage, hydrocephalus, extra-axial
collection or mass lesion. Few remote white matter insults, usually
considered allowable for age. Normal brain volume. Small remote left
medullary infarct.

Vascular: Stable flow voids with persistent non visualization at the
left vertebral artery.

Skull and upper cervical spine: Normal marrow signal.

Sinuses/Orbits: Left maxillary opacification with sclerotic wall
thickening and atelectasis. Also opacified are the left frontal and
anterior ethmoid sinuses.
IMPRESSION: 1. No acute finding.
2. Chronic left-sided sinusitis.

## 2021-05-27 NOTE — Discharge Instructions (Addendum)
You were evaluated in the Emergency Department and after careful evaluation, we did not find any emergent condition requiring admission or further testing in the hospital.  Your exam/testing today was overall reassuring.  MRI did not show any evidence of new stroke.  Symptoms may be related to dehydration.  Please continue drinking more fluids at home and follow-up with your regular doctors.  Please return to the Emergency Department if you experience any worsening of your condition.  Thank you for allowing [place] to be a part of your care.

## 2021-05-27 NOTE — ED Notes (Signed)
Patient is resting comfortably. 

## 2021-05-27 NOTE — ED Notes (Signed)
Pt transport to MRI

## 2022-08-13 ENCOUNTER — Emergency Department (HOSPITAL_COMMUNITY)
Admission: EM | Admit: 2022-08-13 | Discharge: 2022-08-13 | Disposition: A | Discharge status: Home / Self Care | Payer: Medicaid Other | Attending: Emergency Medicine | Admitting: Emergency Medicine

## 2022-08-13 ENCOUNTER — Other Ambulatory Visit: Payer: Self-pay

## 2022-08-13 ENCOUNTER — Encounter (HOSPITAL_COMMUNITY): Payer: Self-pay | Admitting: *Deleted

## 2022-08-13 ENCOUNTER — Emergency Department (HOSPITAL_COMMUNITY): Payer: Medicaid Other

## 2022-08-13 DIAGNOSIS — M545 Low back pain, unspecified: Secondary | ICD-10-CM | POA: Diagnosis not present

## 2022-08-13 DIAGNOSIS — N50812 Left testicular pain: Secondary | ICD-10-CM | POA: Insufficient documentation

## 2022-08-13 DIAGNOSIS — G8929 Other chronic pain: Secondary | ICD-10-CM | POA: Insufficient documentation

## 2022-08-13 DIAGNOSIS — Z7982 Long term (current) use of aspirin: Secondary | ICD-10-CM | POA: Insufficient documentation

## 2022-08-13 DIAGNOSIS — N442 Benign cyst of testis: Secondary | ICD-10-CM

## 2022-08-13 DIAGNOSIS — R221 Localized swelling, mass and lump, neck: Secondary | ICD-10-CM | POA: Diagnosis present

## 2022-08-13 HISTORY — DX: Gastro-esophageal reflux disease without esophagitis: K21.9

## 2022-08-13 LAB — URINALYSIS, ROUTINE W REFLEX MICROSCOPIC
Bilirubin Urine: NEGATIVE
Glucose, UA: NEGATIVE mg/dL
Hgb urine dipstick: NEGATIVE
Ketones, ur: NEGATIVE mg/dL
Leukocytes,Ua: NEGATIVE
Nitrite: NEGATIVE
Protein, ur: NEGATIVE mg/dL
Specific Gravity, Urine: 1.005 (ref 1.005–1.030)
pH: 6 (ref 5.0–8.0)

## 2022-08-13 MED ORDER — KETOROLAC TROMETHAMINE 15 MG/ML IJ SOLN
15.0000 mg | Freq: Once | INTRAMUSCULAR | Status: AC
  Administered 2022-08-13: 15 mg via INTRAMUSCULAR
  Filled 2022-08-13: qty 1

## 2022-08-13 NOTE — ED Triage Notes (Signed)
Pt states he noticed swelling to one of his testicles x 2 weeks ago and went to his PCP but since then the swelling has gotten worse and hurts worse when sitting or lying down

## 2022-08-13 NOTE — ED Provider Notes (Signed)
[facility] EMERGENCY DEPARTMENT Provider Note   CSN: [id number] Arrival date & time: 08/13/22  1010     History Chief Complaint  Patient presents with   Groin Swelling    Justin Price is a 61 y.o. male patient who presents to the emergency department with left testicular pain has been ongoing for 2 weeks.  Patient was seen evaluated at his primary care doctor for similar symptoms and after physical exam of the left testicle he has been having significant pain since then.  Ultrasound was just ordered in the outpatient setting but not yet performed.  Patient also suffers from chronic back pain but his back pain has been worsened with this testicular pain.  Pain radiates down bilateral legs.  Denies any bowel or bladder incontinence.  No saddle anesthesia.  HPI     Home Medications Prior to Admission medications   Medication Sig Start Date End Date Taking? Authorizing Provider  acetaminophen (TYLENOL) 325 MG tablet Take 2 tablets (650 mg total) by mouth every 4 (four) hours as needed for mild pain or headache (or temp > 37.5 C (99.5 F)). 01/17/20   Emokpae, Courage, MD  albuterol (VENTOLIN HFA) 108 (90 Base) MCG/ACT inhaler Inhale 2 puffs into the lungs every 6 (six) hours as needed. 12/13/19   [provider]  ALPRAZolam Prudy Feeler) 1 MG tablet Take 1 mg by mouth 2 (two) times daily as needed for anxiety.    [provider]  aspirin EC 81 MG tablet Take 1 tablet (81 mg total) by mouth daily with breakfast. Take Aspirin 81 mg daily along with Plavix 75 mg daily for 30 days then after that STOP the aspirin and continue ONLY Plavix 75 mg daily indefinitely 01/17/20   Shon Hale, MD  atorvastatin (LIPITOR) 40 MG tablet Take 1 tablet (40 mg total) by mouth every evening. For stroke prevention 01/17/20 01/16/21  Shon Hale, MD  diclofenac Sodium (VOLTAREN) 1 % GEL Apply 1 application topically in the morning, at noon, in the evening, and at bedtime.    [provider]  hydrOXYzine (ATARAX/VISTARIL) 25 MG tablet Take 25 mg by mouth 2 (two) times daily as needed. 01/09/20   [provider]  omeprazole (PRILOSEC) 20 MG capsule Take 20 mg by mouth daily. 11/01/19   [provider]  SUBOXONE 4-1 MG FILM Place 1 strip under the tongue 2 (two) times daily. 01/15/20   [provider]      Allergies    Morphine and related    Review of Systems   Review of Systems  All other systems reviewed and are negative.   Physical Exam Updated Vital Signs BP 114/81 (BP Location: Right Arm)   Pulse (!) 119   Temp 97.9 F (36.6 C) (Oral)   Resp 18   Ht 5\' 10"  (1.778 m)   Wt 91.2 kg   SpO2 92%   BMI 28.84 kg/m  Physical Exam Vitals and nursing note reviewed. Exam conducted with a chaperone present.  Constitutional:      Appearance: Normal appearance.  HENT:     Head: Normocephalic and atraumatic.  Eyes:     General:        Right eye: No discharge.        Left eye: No discharge.     Conjunctiva/sclera: Conjunctivae normal.  Pulmonary:     Effort: Pulmonary effort is normal.  Genitourinary:    Comments: There is a palpable soft mass at the superior aspect of the left  testicle.  It is nontender to palpation.  There is no surrounding erythema or fluctuance.  Testicles are of normal appearance and normal lie.  No significant testicular swelling. Musculoskeletal:     Comments: There is midline tenderness over the lumbar spine.  5/5 strength to the lower extremities.  Normal sensation to lower extremities.  Skin:    General: Skin is warm and dry.     Findings: No rash.  Neurological:     General: No focal deficit present.     Mental Status: He is alert.  Psychiatric:        Mood and Affect: Mood normal.        Behavior: Behavior normal.     ED Results / Procedures / Treatments   Labs (all labs ordered are listed, but only abnormal results are displayed) Labs Reviewed  URINALYSIS, ROUTINE W REFLEX MICROSCOPIC     EKG None  Radiology US SCROTUM W/DOPPLER  Result Date: 08/13/2022 CLINICAL DATA:  Pain and swelling x2 weeks, previous vasectomy EXAM: SCROTAL ULTRASOUND DOPPLER ULTRASOUND OF THE TESTICLES TECHNIQUE: Complete ultrasound examination of the testicles, epididymis, and other scrotal structures was performed. Color and spectral Doppler ultrasound were also utilized to evaluate blood flow to the testicles. COMPARISON:  CT 10/09/2017 FINDINGS: Right testicle Measurements: 4.7 x 2 x 2.6 cm. No mass or microlithiasis visualized. Left testicle Measurements: 5.2 x 2.1 x 2.9 cm. No mass or microlithiasis visualized. Right epididymis:  Normal in size and appearance. Left epididymis: Simple appearing epididymal cysts measuring up to 2.9 cm diameter. Hydrocele:  Trace, bilateral Varicocele:  None visualized. Pulsed Doppler interrogation of both testes demonstrates normal low resistance arterial and venous waveforms bilaterally. IMPRESSION: 1. Normal bilateral testes. 2. Left epididymal benign appearing cysts. Electronically Signed   By: Corlis Leak M.D.   On: 08/13/2022 12:30   DG Lumbar Spine Complete  Result Date: 08/13/2022 CLINICAL DATA:  Back pain, pain both lower extremities EXAM: LUMBAR SPINE - COMPLETE 4+ VIEW COMPARISON:  02/07/2021 FINDINGS: No recent fracture is seen. Degenerative changes are noted with anterior bony spurs, more so at T11-T12 and L1-L2 levels. Degenerative changes are noted in facet joints, more so at the L4-L5 and L5-S1 levels. There is narrowing of disc space at L4-L5 and L5-S1 levels with no significant interval change. IMPRESSION: No recent fracture is seen. Lumbar spondylosis. No significant interval changes are noted since 02/07/2021. Electronically Signed   By: Ernie Avena M.D.   On: 08/13/2022 11:53    Procedures Procedures    Medications Ordered in ED Medications  ketorolac (TORADOL) 15 MG/ML injection 15 mg (has no administration in time range)    ED Course/  Medical Decision Making/ A&P Clinical Course as of 08/13/22 1302  Thu Aug 13, 2022  1253 I updated the patient on labs and imaging results.  I will give him Toradol for pain.  I will also give him follow-up with orthopedics. [CF]  1254 US SCROTUM W/DOPPLER Scrotum Doppler reveals a benign cyst.  No evidence of torsion.  I do agree with radiologist interpretation. [CF]  1254 DG Lumbar Spine Complete I personally ordered and interpreted imaging of the lumbar spine which shows some arthritic changes but no acute fracture.  I do agree with radiologist interpretation. [CF]  1258 Urinalysis, Routine w reflex microscopic Urine, Clean Catch Negative. [CF]    Clinical Course User Index [CF] Teressa Lower, [place]  Medical Decision Making BANKS CHAIKIN is a 61 y.o. male patient who presents to the emergency department today for further evaluation of left-sided testicular pain.  I am suspicious for possible varicocele.  We will get ultrasound to further evaluate given the patient's age.  We will also get urinalysis in addition to a lumbar x-ray to further evaluate of his back pain.  I will plan to reassess once some of the results return.  Went over all imaging and labs with him and family at the bedside.  They expressed full understanding.  Pain is likely caused from a benign cyst.  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  As highlighted in the physical exam, he does have full strength in lower extremities and is neurologically intact at this time.  I have a low suspicion for cauda equina.  He is safe for discharge at this time.  Amount and/or Complexity of Data Reviewed Labs: ordered. Decision-making details documented in ED Course. Radiology: ordered. Decision-making details documented in ED  Course.  Risk Prescription drug management.    Final Clinical Impression(s) / ED Diagnoses Final diagnoses:  Testicular cyst  Acute midline low back pain, unspecified whether sciatica present    Rx / DC Orders ED Discharge Orders     None         Jolyn Lent 08/13/22 1302    Terrilee Files, MD 08/13/22 1742

## 2022-08-13 NOTE — Discharge Instructions (Signed)
Please continue taking Tylenol for pain.  Follow-up with orthopedics for further evaluation.  You may return to the emergency department for any worsening symptoms you might have.

## 2022-08-18 ENCOUNTER — Telehealth (HOSPITAL_COMMUNITY): Payer: Medicaid Other | Admitting: Psychiatry

## 2022-08-18 ENCOUNTER — Encounter (HOSPITAL_COMMUNITY): Payer: Self-pay

## 2023-08-30 ENCOUNTER — Other Ambulatory Visit (HOSPITAL_COMMUNITY): Payer: Self-pay | Admitting: Gerontology

## 2023-08-30 DIAGNOSIS — Z87891 Personal history of nicotine dependence: Secondary | ICD-10-CM

## 2023-09-16 ENCOUNTER — Encounter (HOSPITAL_COMMUNITY): Payer: Self-pay

## 2023-09-16 ENCOUNTER — Ambulatory Visit (HOSPITAL_COMMUNITY): Admission: RE | Admit: 2023-09-16 | Payer: Medicaid Other | Source: Ambulatory Visit
